# Patient Record
Sex: Male | Born: 1980 | Race: White | Hispanic: No | Marital: Single | Smoking: Former smoker
Health system: Southern US, Community
[De-identification: ages and names within clinical notes are randomized; demographics above are authoritative.]

## PROBLEM LIST (undated history)

## (undated) ENCOUNTER — Inpatient Hospital Stay (HOSPITAL_COMMUNITY): Payer: Medicare Other | Admitting: Podiatry

## (undated) DIAGNOSIS — I10 Essential (primary) hypertension: Secondary | ICD-10-CM

## (undated) DIAGNOSIS — F419 Anxiety disorder, unspecified: Secondary | ICD-10-CM

## (undated) DIAGNOSIS — F329 Major depressive disorder, single episode, unspecified: Secondary | ICD-10-CM

## (undated) DIAGNOSIS — I739 Peripheral vascular disease, unspecified: Secondary | ICD-10-CM

## (undated) DIAGNOSIS — K219 Gastro-esophageal reflux disease without esophagitis: Secondary | ICD-10-CM

## (undated) DIAGNOSIS — F32A Depression, unspecified: Secondary | ICD-10-CM

## (undated) DIAGNOSIS — G4730 Sleep apnea, unspecified: Secondary | ICD-10-CM

## (undated) DIAGNOSIS — E119 Type 2 diabetes mellitus without complications: Secondary | ICD-10-CM

## (undated) HISTORY — PX: OTHER SURGICAL HISTORY: SHX169

---
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
## 2004-05-06 DIAGNOSIS — F419 Anxiety disorder, unspecified: Secondary | ICD-10-CM

## 2004-05-06 HISTORY — DX: Anxiety disorder, unspecified: F41.9

## 2014-01-03 ENCOUNTER — Encounter (HOSPITAL_COMMUNITY): Payer: Self-pay | Admitting: Emergency Medicine

## 2014-01-03 ENCOUNTER — Emergency Department (HOSPITAL_COMMUNITY)
Admission: EM | Admit: 2014-01-03 | Discharge: 2014-01-03 | Disposition: A | Discharge status: Home / Self Care | Payer: Medicare Other | Attending: Emergency Medicine | Admitting: Emergency Medicine

## 2014-01-03 ENCOUNTER — Emergency Department (HOSPITAL_COMMUNITY): Payer: Medicare Other

## 2014-01-03 DIAGNOSIS — R5383 Other fatigue: Secondary | ICD-10-CM

## 2014-01-03 DIAGNOSIS — E669 Obesity, unspecified: Secondary | ICD-10-CM | POA: Insufficient documentation

## 2014-01-03 DIAGNOSIS — Z8719 Personal history of other diseases of the digestive system: Secondary | ICD-10-CM | POA: Insufficient documentation

## 2014-01-03 DIAGNOSIS — F17200 Nicotine dependence, unspecified, uncomplicated: Secondary | ICD-10-CM | POA: Insufficient documentation

## 2014-01-03 DIAGNOSIS — R5381 Other malaise: Secondary | ICD-10-CM | POA: Insufficient documentation

## 2014-01-03 DIAGNOSIS — R0989 Other specified symptoms and signs involving the circulatory and respiratory systems: Secondary | ICD-10-CM | POA: Insufficient documentation

## 2014-01-03 DIAGNOSIS — Z8659 Personal history of other mental and behavioral disorders: Secondary | ICD-10-CM | POA: Insufficient documentation

## 2014-01-03 DIAGNOSIS — R0609 Other forms of dyspnea: Secondary | ICD-10-CM | POA: Insufficient documentation

## 2014-01-03 DIAGNOSIS — R079 Chest pain, unspecified: Secondary | ICD-10-CM | POA: Insufficient documentation

## 2014-01-03 HISTORY — DX: Gastro-esophageal reflux disease without esophagitis: K21.9

## 2014-01-03 HISTORY — DX: Anxiety disorder, unspecified: F41.9

## 2014-01-03 LAB — CBC
HCT: 44.4 % (ref 39.0–52.0)
Hemoglobin: 15.7 g/dL (ref 13.0–17.0)
MCH: 30 pg (ref 26.0–34.0)
MCHC: 35.4 g/dL (ref 30.0–36.0)
MCV: 84.9 fL (ref 78.0–100.0)
Platelets: 231 10*3/uL (ref 150–400)
RBC: 5.23 MIL/uL (ref 4.22–5.81)
RDW: 13.1 % (ref 11.5–15.5)
WBC: 12.1 10*3/uL — ABNORMAL HIGH (ref 4.0–10.5)

## 2014-01-03 LAB — TROPONIN I: Troponin I: <0.3 ng/mL (ref <0.30)

## 2014-01-03 LAB — PROTIME-INR
INR: 1 (ref 0.00–1.49)
PROTHROMBIN TIME: 13.2 s (ref 11.6–15.2)

## 2014-01-03 LAB — COMPREHENSIVE METABOLIC PANEL
ALBUMIN: 3.8 g/dL (ref 3.5–5.2)
ALT: 28 U/L (ref 0–53)
ANION GAP: 14 (ref 5–15)
AST: 15 U/L (ref 0–37)
Alkaline Phosphatase: 48 U/L (ref 39–117)
BUN: 14 mg/dL (ref 6–23)
CALCIUM: 9.3 mg/dL (ref 8.4–10.5)
CO2: 26 mEq/L (ref 19–32)
CREATININE: 0.66 mg/dL (ref 0.50–1.35)
Chloride: 95 mEq/L — ABNORMAL LOW (ref 96–112)
GFR calc non Af Amer: >90 mL/min (ref >90)
Glucose, Bld: 207 mg/dL — ABNORMAL HIGH (ref 70–99)
Potassium: 3.8 mEq/L (ref 3.7–5.3)
Sodium: 135 mEq/L — ABNORMAL LOW (ref 137–147)
TOTAL PROTEIN: 7.3 g/dL (ref 6.0–8.3)
Total Bilirubin: 0.4 mg/dL (ref 0.3–1.2)

## 2014-01-03 LAB — MAGNESIUM: Magnesium: 2 mg/dL (ref 1.5–2.5)

## 2014-01-03 LAB — PRO B NATRIURETIC PEPTIDE

## 2014-01-03 MED ORDER — ASPIRIN 81 MG PO CHEW: 324.0000 mg | CHEWABLE_TABLET | Freq: Once | ORAL | Status: DC

## 2014-01-03 NOTE — ED Notes (Signed)
Pt presents to ED via EMS with c/o chest pain, radiates to left arm, onset today while at work. Pt states he felt like his heart was raising prior to chest pain. Pt denies chest pain at this time. Pt was seen at [facility] last 2 Sundays and PCP for the same. Pt was prescribed Ativan and Reglan at PCP. Pt reports Hx of anxiety and GERD. Per EMS, BP-118/86, HR-96-100 regular. Pt taken  ASA prior arrival to ED, EMS placed 20G IV line at left hand. Airway intact and alerts and oriented x4 at this time.

## 2014-01-03 NOTE — ED Notes (Signed)
Pt reports left sided cp that radiated to his left arm pit. 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 Pt states he had a hot flash, dizziness, and weakness in his arms. Pt states he believes it may have been a panic attack or gas. Pt states he takes ativan to control his anxiety and has been to ED 3 times in the last 10 days for the same sx. Pt denies any pain at this time.

## 2014-01-03 NOTE — ED Provider Notes (Signed)
CSN: [id number]     Arrival date & time 01/03/14  1855 History   First MD Initiated Contact with Patient 01/03/14 1855     Chief Complaint  Patient presents with  . Chest Pain     (Consider location/radiation/quality/duration/timing/severity/associated sxs/prior Treatment) HPI Patient presents with chest pain.  Patient has had episodes in the past few days that are similar to today's. He notes anterior chest pressure. There is associated diaphoresis, dyspnea, fatigue, anxiety he has been seen at other facilities in the past few days, including yesterday, when he was started on an anxiolytic. Patient currently denies dyspnea, palpitations, lightheadedness, syncope. Patient notes that he has a history of anxiety, as well as heartburn. He denies a history of coronary disease. Patient does not smoke. Patient uses smokeless tobacco.  Past Medical History  Diagnosis Date  . GERD (gastroesophageal reflux disease)   . Anxiety 2006   History reviewed. No pertinent past surgical history. History reviewed. No pertinent family history. History  Substance Use Topics  . Smoking status: Current Every Day Smoker -- 0.00 packs/day    Types: Cigarettes  . Smokeless tobacco: Not on file  . Alcohol Use: Not on file    Review of Systems  Constitutional:       Per HPI, otherwise negative  HENT:       Per HPI, otherwise negative  Respiratory:       Per HPI, otherwise negative  Cardiovascular:       Per HPI, otherwise negative  Gastrointestinal: Negative for vomiting.  Endocrine:       Negative aside from HPI  Genitourinary:       Neg aside from HPI   Musculoskeletal:       Per HPI, otherwise negative  Skin: Negative.   Neurological: Negative for syncope.      Allergies  Review of patient's allergies indicates no known allergies.  Home Medications   Prior to Admission medications   Not on File   BP 160/94  Pulse 99  Temp(Src) 98.1 F (36.7 C) (Oral)  Resp 19  SpO2  95% Physical Exam  Nursing note and vitals reviewed. Constitutional: He is oriented to person, place, and time. He appears well-developed. No distress.  Obese young male  HENT:  Head: Normocephalic and atraumatic.  Eyes: Conjunctivae and EOM are normal.  Cardiovascular: Normal rate and regular rhythm.   Pulmonary/Chest: Effort normal. No stridor. No respiratory distress.  Abdominal: He exhibits no distension.  Musculoskeletal: He exhibits no edema.  Neurological: He is alert and oriented to person, place, and time.  Skin: Skin is warm and dry.  Psychiatric: His mood appears anxious.    ED Course  Procedures (including critical care time) Labs Review Labs Reviewed  CBC - Abnormal; Notable for the following:    WBC 12.1 (*)    All other components within normal limits  COMPREHENSIVE METABOLIC PANEL - Abnormal; Notable for the following:    Sodium 135 (*)    Chloride 95 (*)    Glucose, Bld 207 (*)    All other components within normal limits  PRO B NATRIURETIC PEPTIDE  MAGNESIUM  PROTIME-INR  TROPONIN I    Imaging Review Dg Chest 2 View  01/03/2014   CLINICAL DATA:  Panic attack, chest pain  EXAM: CHEST  2 VIEW  COMPARISON:  12/26/2013  FINDINGS: Lungs are clear.  No pleural effusion or pneumothorax.  The heart is normal in size.  Visualized osseous structures are within normal limits.  IMPRESSION: Normal chest  radiographs.   Electronically Signed   By: Charline Bills M.D.   On: 01/03/2014 20:04    O2- 99%ra, nml EKG with sinus tachycardia, rate 100, T wave abnormalities, abnormal  MDM  This young male presents with ongoing chest pain.  Notably, the patient seen yesterday for similar concerns, and on initial exam is uncomfortable appearing, anxious, though in no distress. Patient has appropriate cognition, and given the patient's youth, he is normal troponin, nonischemic EKG are reassuring. Patient was recently started on anxiolytics, which seems appropriate. Patient  does have mild leukocytosis, though his labs are otherwise largely reassuring.  The patient was prepared for discharge with further evaluation and management as an outpatient.   Gerhard Munch, MD 01/03/14 2129

## 2014-01-03 NOTE — ED Notes (Signed)
Discharge instructions reviewed with pt. Pt verbalized understanding.   

## 2014-01-03 NOTE — Discharge Instructions (Signed)
As discussed, your evaluation today has been largely reassuring.  But, it is important that you monitor your condition carefully, and do not hesitate to return to the ED if you develop new, or concerning changes in your condition. ° °Otherwise, please follow-up with your physician for appropriate ongoing care. ° °Chest Pain (Nonspecific) °It is often hard to give a specific diagnosis for the cause of chest pain. There is always a chance that your pain could be related to something serious, such as a heart attack or a blood clot in the lungs. You need to follow up with your health care provider for further evaluation. °CAUSES  °· Heartburn. °· Pneumonia or bronchitis. °· Anxiety or stress. °· Inflammation around your heart (pericarditis) or lung (pleuritis or pleurisy). °· A blood clot in the lung. °· A collapsed lung (pneumothorax). It can develop suddenly on its own (spontaneous pneumothorax) or from trauma to the chest. °· Shingles infection (herpes zoster virus). °The chest wall is composed of bones, muscles, and cartilage. Any of these can be the source of the pain. °· The bones can be bruised by injury. °· The muscles or cartilage can be strained by coughing or overwork. °· The cartilage can be affected by inflammation and become sore (costochondritis). °DIAGNOSIS  °Lab tests or other studies may be needed to find the cause of your pain. Your health care provider may have you take a test called an ambulatory electrocardiogram (ECG). An ECG records your heartbeat patterns over a 24-hour period. You may also have other tests, such as: °· Transthoracic echocardiogram (TTE). During echocardiography, sound waves are used to evaluate how blood flows through your heart. °· Transesophageal echocardiogram (TEE). °· Cardiac monitoring. This allows your health care provider to monitor your heart rate and rhythm in real time. °· Holter monitor. This is a portable device that records your heartbeat and can help diagnose  heart arrhythmias. It allows your health care provider to track your heart activity for several days, if needed. °· Stress tests by exercise or by giving medicine that makes the heart beat faster. °TREATMENT  °· Treatment depends on what may be causing your chest pain. Treatment may include: °¨ Acid blockers for heartburn. °¨ Anti-inflammatory medicine. °¨ Pain medicine for inflammatory conditions. °¨ Antibiotics if an infection is present. °· You may be advised to change lifestyle habits. This includes stopping smoking and avoiding alcohol, caffeine, and chocolate. °· You may be advised to keep your head raised (elevated) when sleeping. This reduces the chance of acid going backward from your stomach into your esophagus. °Most of the time, nonspecific chest pain will improve within 2-3 days with rest and mild pain medicine.  °HOME CARE INSTRUCTIONS  °· If antibiotics were prescribed, take them as directed. Finish them even if you start to feel better. °· For the next few days, avoid physical activities that bring on chest pain. Continue physical activities as directed. °· Do not use any tobacco products, including cigarettes, chewing tobacco, or electronic cigarettes. °· Avoid drinking alcohol. °· Only take medicine as directed by your health care provider. °· Follow your health care provider's suggestions for further testing if your chest pain does not go away. °· Keep any follow-up appointments you made. If you do not go to an appointment, you could develop lasting (chronic) problems with pain. If there is any problem keeping an appointment, call to reschedule. °SEEK MEDICAL CARE IF:  °· Your chest pain does not go away, even after treatment. °· You have   a rash with blisters on your chest. °· You have a fever. °SEEK IMMEDIATE MEDICAL CARE IF:  °· You have increased chest pain or pain that spreads to your arm, neck, jaw, back, or abdomen. °· You have shortness of breath. °· You have an increasing cough, or you  cough up blood. °· You have severe back or abdominal pain. °· You feel nauseous or vomit. °· You have severe weakness. °· You faint. °· You have chills. °This is an emergency. Do not wait to see if the pain will go away. Get medical help at once. Call your local emergency services (911 in [place] Do not drive yourself to the hospital. °MAKE SURE YOU:  °· Understand these instructions. °· Will watch your condition. °· Will get help right away if you are not doing well or get worse. °Document Released: 01/30/2005 Document Revised: 04/27/2013 Document Reviewed: 11/26/2007 °[employer]® Patient Information ©2015 [employer]. This information is not intended to replace advice given to you by your health care provider. Make sure you discuss any questions you have with your health care provider. ° °

## 2015-12-21 IMAGING — CR DG CHEST 2V
2 series · 2 of 2 positions shown · non-contrast
Comparison: 12/26/2013

CLINICAL DATA: Panic attack, chest pain

EXAM:
CHEST  2 VIEW

[w chest pa]
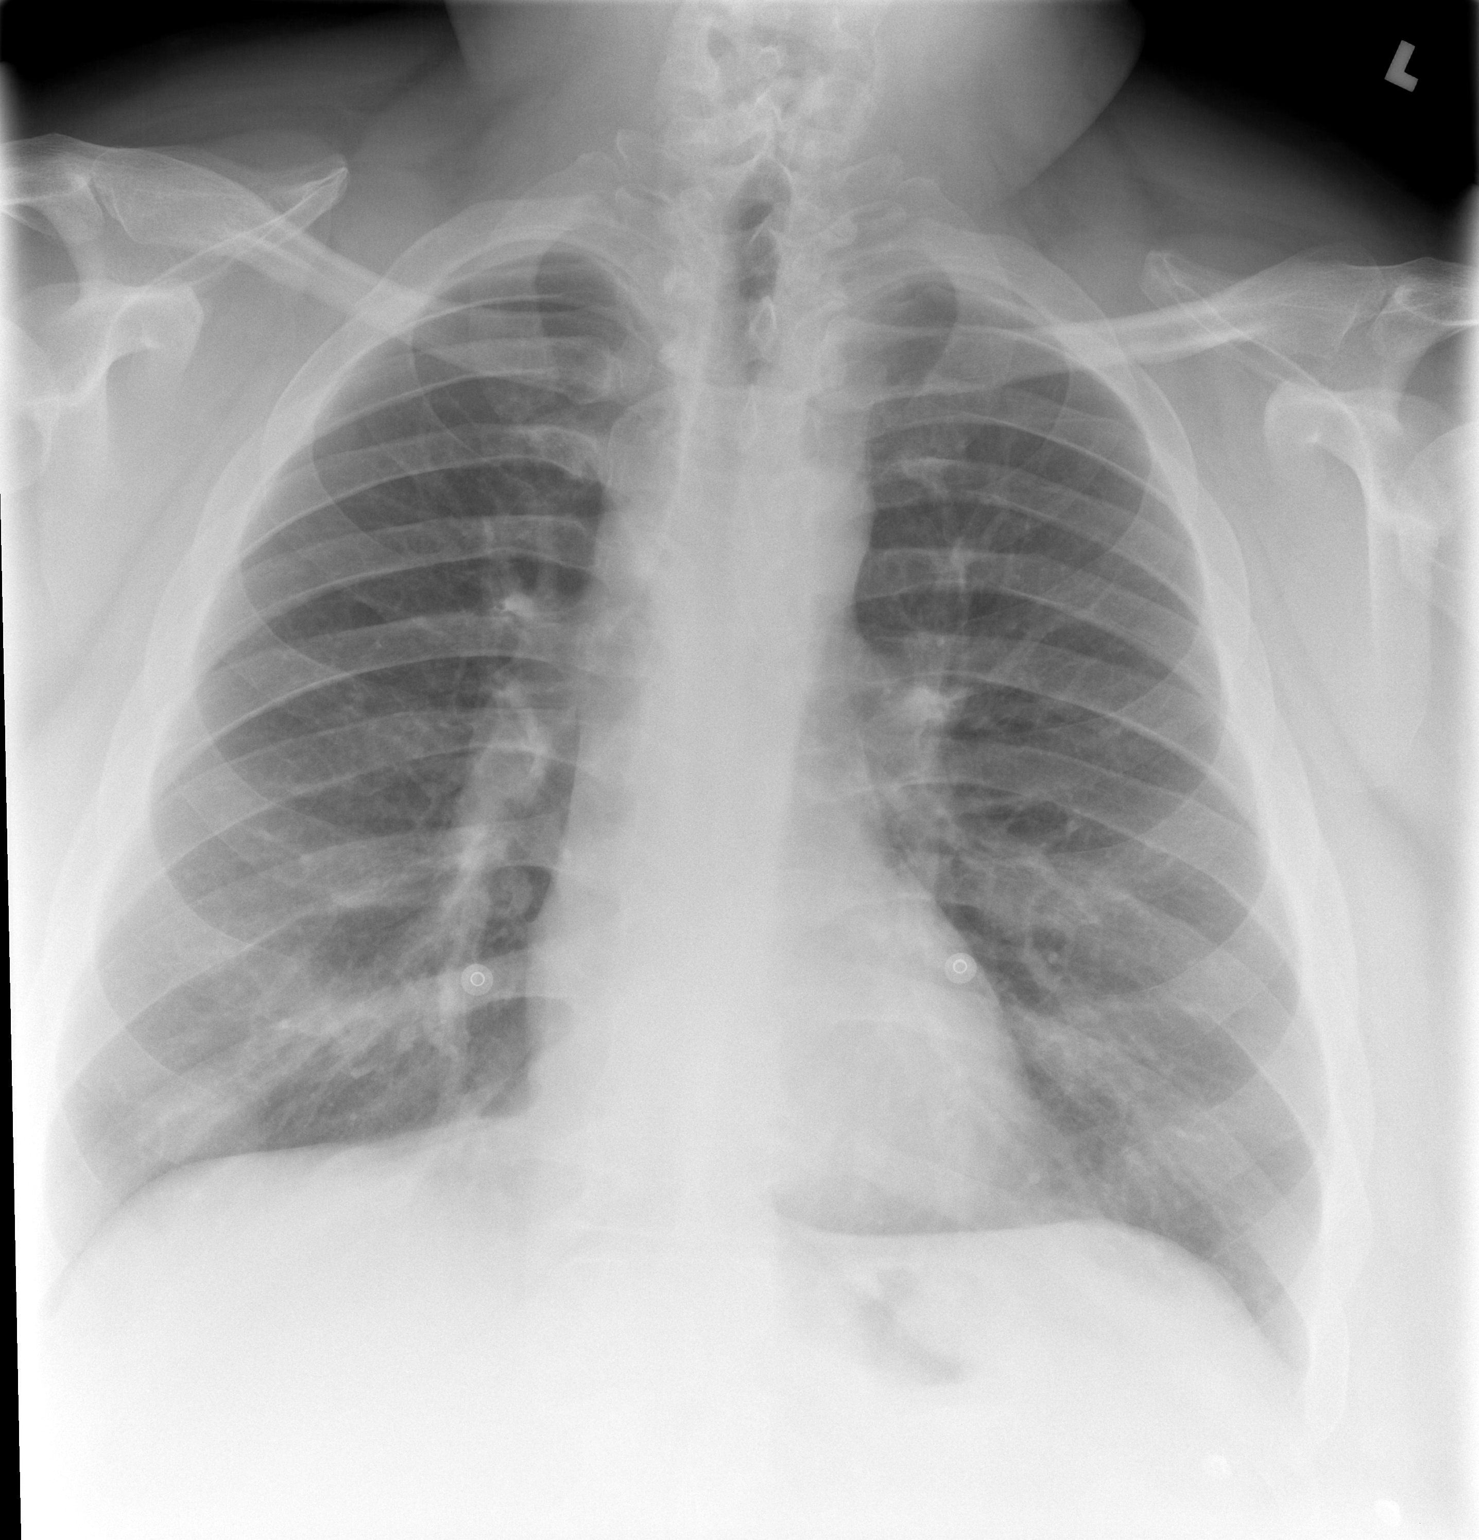

[w chest lat]
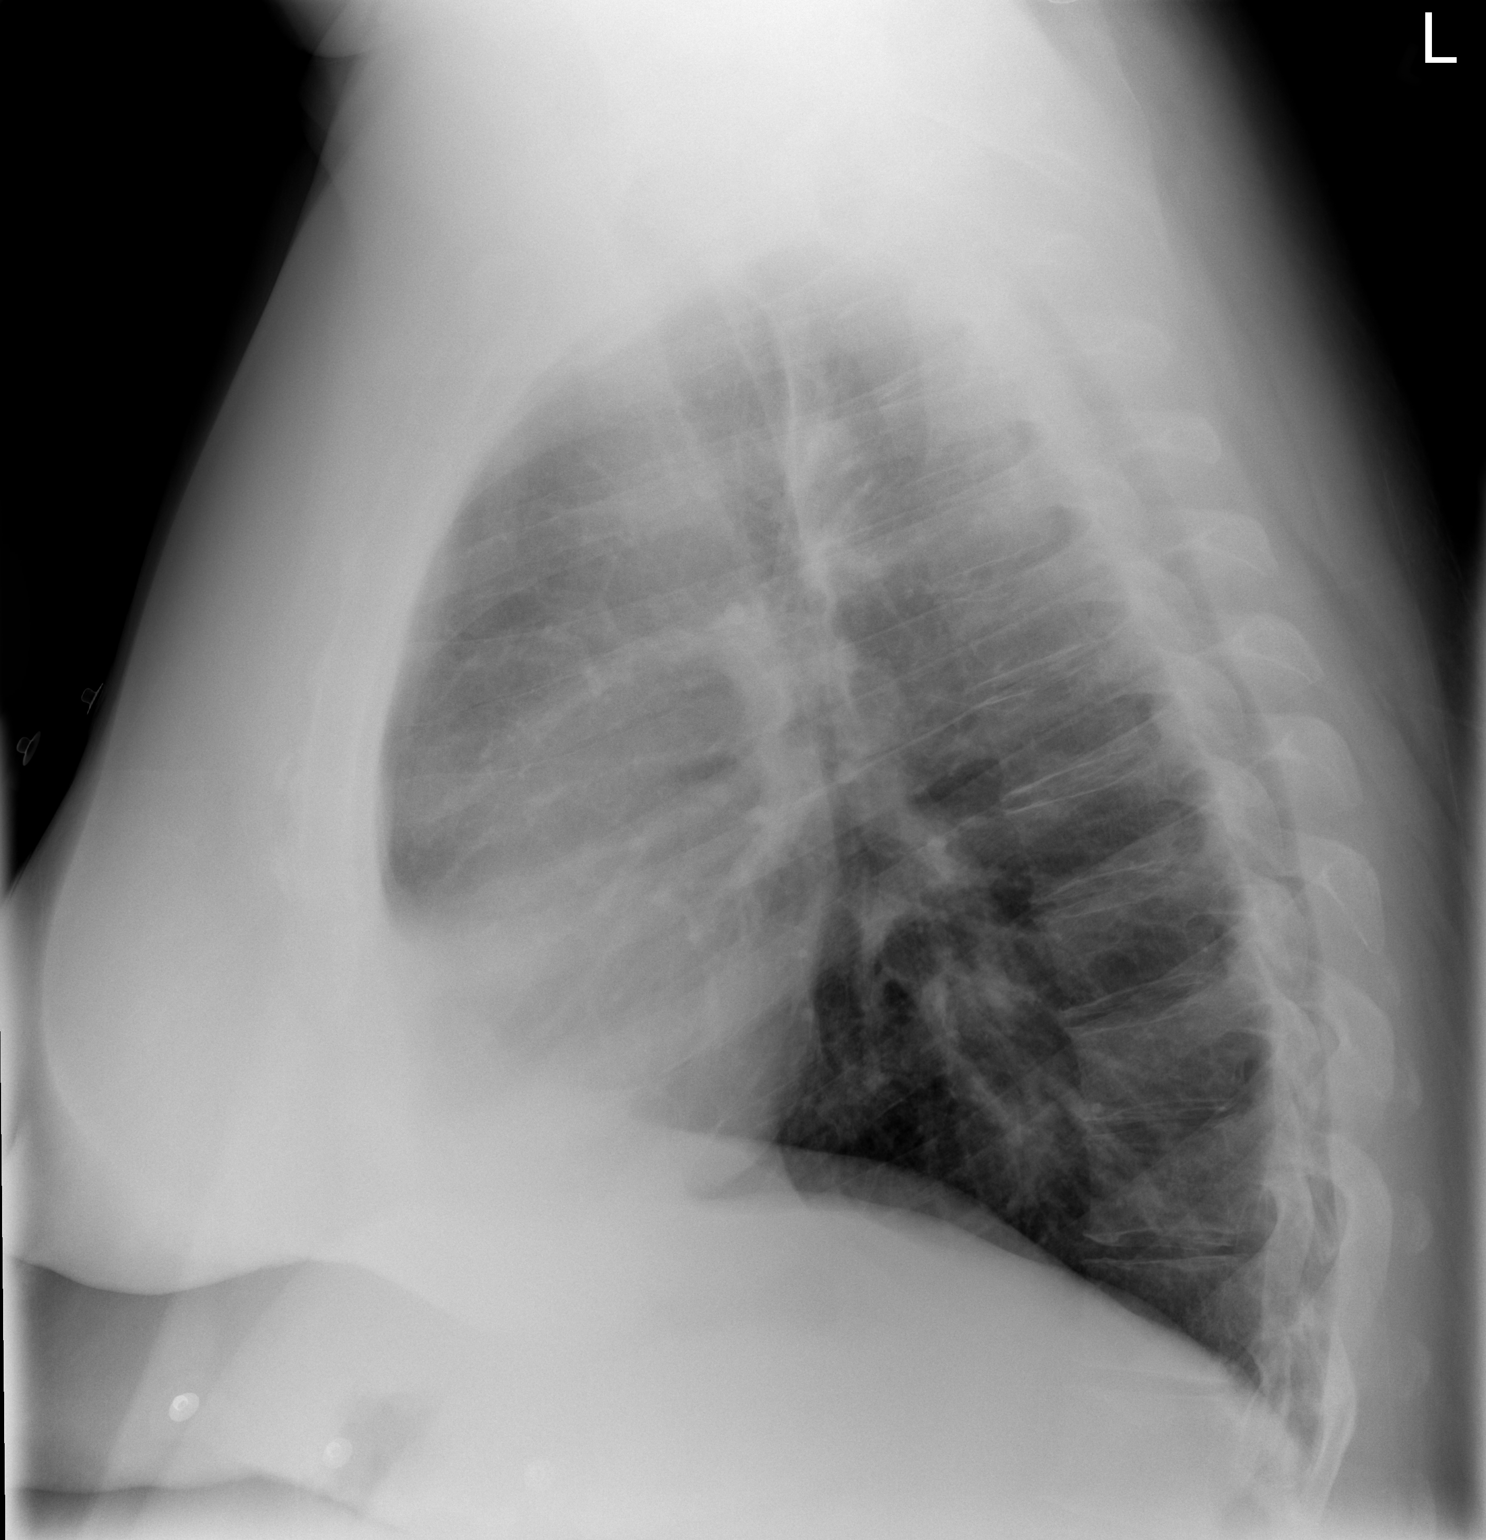

[2 of 2 positions shown; findings below may reference images not displayed]

FINDINGS: Lungs are clear.  No pleural effusion or pneumothorax.

The heart is normal in size.

Visualized osseous structures are within normal limits.
IMPRESSION: Normal chest radiographs.

## 2017-05-27 DIAGNOSIS — F4310 Post-traumatic stress disorder, unspecified: Secondary | ICD-10-CM | POA: Insufficient documentation

## 2018-12-10 DIAGNOSIS — L97522 Non-pressure chronic ulcer of other part of left foot with fat layer exposed: Secondary | ICD-10-CM | POA: Insufficient documentation

## 2018-12-21 DIAGNOSIS — M2042 Other hammer toe(s) (acquired), left foot: Secondary | ICD-10-CM | POA: Insufficient documentation

## 2019-01-30 DIAGNOSIS — R45851 Suicidal ideations: Secondary | ICD-10-CM | POA: Insufficient documentation

## 2019-02-11 DIAGNOSIS — E119 Type 2 diabetes mellitus without complications: Secondary | ICD-10-CM | POA: Insufficient documentation

## 2019-02-11 DIAGNOSIS — I10 Essential (primary) hypertension: Secondary | ICD-10-CM | POA: Insufficient documentation

## 2019-03-05 DIAGNOSIS — Z6841 Body Mass Index (BMI) 40.0 and over, adult: Secondary | ICD-10-CM | POA: Insufficient documentation

## 2019-04-05 ENCOUNTER — Other Ambulatory Visit: Payer: Self-pay

## 2019-04-05 ENCOUNTER — Other Ambulatory Visit: Payer: Self-pay | Admitting: Podiatry

## 2019-04-05 ENCOUNTER — Ambulatory Visit (INDEPENDENT_AMBULATORY_CARE_PROVIDER_SITE_OTHER): Payer: Medicare Other | Admitting: Podiatry

## 2019-04-05 ENCOUNTER — Ambulatory Visit (INDEPENDENT_AMBULATORY_CARE_PROVIDER_SITE_OTHER): Payer: Medicare Other

## 2019-04-05 DIAGNOSIS — L97524 Non-pressure chronic ulcer of other part of left foot with necrosis of bone: Secondary | ICD-10-CM

## 2019-04-05 DIAGNOSIS — M792 Neuralgia and neuritis, unspecified: Secondary | ICD-10-CM

## 2019-04-05 DIAGNOSIS — Z9889 Other specified postprocedural states: Secondary | ICD-10-CM

## 2019-04-05 DIAGNOSIS — L03032 Cellulitis of left toe: Secondary | ICD-10-CM

## 2019-04-05 DIAGNOSIS — L02612 Cutaneous abscess of left foot: Secondary | ICD-10-CM

## 2019-04-05 NOTE — Progress Notes (Signed)
  Subjective:  Patient ID: Johnny Griffin, male    DOB: [birth year],  MRN: [id number]  Chief Complaint  Patient presents with  . Routine Post Op    POV 02/13/19 2nd partial amputation -pt states he wasn't able to F/U with his sx Dr. due to transporation issues -pt dneis N/V/F/Ch/swelling or drainage -w/ slight redness Tx: none -w/ occasional pain at lateral sid eof foot     DOS: 02/13/2019 Procedure: Left 2nd toe DIPJ amputation (Dr. Neomia Dear - [facility])  38 y.o. male returns for post-op check. Hx as above. Was not able to f/u with original surgeon d/t transportation issues.  Review of Systems: Negative except as noted in the HPI. Denies N/V/F/Ch.  Past Medical History:  Diagnosis Date  . Anxiety 2006  . GERD (gastroesophageal reflux disease)     Current Outpatient Medications:  .  aspirin 325 MG tablet, Take 325 mg by mouth daily., Disp: , Rfl:  .  ketorolac (TORADOL) 10 MG tablet, Take 10 mg by mouth every 6 (six) hours as needed for moderate pain., Disp: , Rfl:  .  lisinopril-hydrochlorothiazide (PRINZIDE,ZESTORETIC) 20-12.5 MG per tablet, Take 1 tablet by mouth daily., Disp: , Rfl:  .  LORazepam (ATIVAN) 1 MG tablet, Take 1 mg by mouth every 8 (eight) hours., Disp: , Rfl:  .  metFORMIN (GLUCOPHAGE) 1000 MG tablet, Take 1,000 mg by mouth 2 (two) times daily with a meal., Disp: , Rfl:  .  metoCLOPramide (REGLAN) 10 MG tablet, Take 10 mg by mouth 4 (four) times daily., Disp: , Rfl:  .  PARoxetine (PAXIL) 20 MG tablet, Take 20 mg by mouth daily., Disp: , Rfl:   Social History   Tobacco Use  Smoking Status Current Every Day Smoker  . Packs/day: 0.00  . Types: Cigarettes    No Known Allergies Objective:  There were no vitals filed for this visit. There is no height or weight on file to calculate BMI. Constitutional Well developed. Well nourished.  Vascular Foot warm and well perfused. Capillary refill normal to all digits.   Neurologic Normal speech. Oriented to person,  place, and time. Epicritic sensation to light touch grossly present bilaterally.  Dermatologic Skin healing well without signs of infection. Skin edges well coapted without signs of infection.  Orthopedic: No tenderness to palpation noted about the surgical site.   Radiographs: Taken and reviewed c/w post-op state. Assessment:   1. Ulcer of toe, left, with necrosis of bone ([place])   2. Post-operative state   3. Cellulitis and abscess of toe of left foot   4. Peripheral neuropathic pain    Plan:  Patient was evaluated and treated and all questions answered.  S/p foot surgery left -Progressing as expected post-operatively. -XR: As above -WB Status: WBAT in normal shoegear -Sutures: removed. Ointment and band-aid applied. -Medications: None -Foot redressed.  Neuropathic pain -Discussed continued use of Gabapentin and controlling sugars more stringently to prevent pain.  Return in about 1 month (around 05/05/2019) for Post-Op (No XRs).

## 2019-04-11 DIAGNOSIS — I10 Essential (primary) hypertension: Secondary | ICD-10-CM | POA: Diagnosis not present

## 2019-04-11 DIAGNOSIS — E119 Type 2 diabetes mellitus without complications: Secondary | ICD-10-CM | POA: Diagnosis not present

## 2019-04-11 DIAGNOSIS — E871 Hypo-osmolality and hyponatremia: Secondary | ICD-10-CM

## 2019-04-12 DIAGNOSIS — L601 Onycholysis: Secondary | ICD-10-CM | POA: Diagnosis not present

## 2019-04-12 DIAGNOSIS — M79674 Pain in right toe(s): Secondary | ICD-10-CM | POA: Diagnosis not present

## 2019-04-12 DIAGNOSIS — I10 Essential (primary) hypertension: Secondary | ICD-10-CM | POA: Diagnosis not present

## 2019-04-12 DIAGNOSIS — L02619 Cutaneous abscess of unspecified foot: Secondary | ICD-10-CM

## 2019-04-12 DIAGNOSIS — L03119 Cellulitis of unspecified part of limb: Secondary | ICD-10-CM | POA: Diagnosis not present

## 2019-04-12 DIAGNOSIS — E871 Hypo-osmolality and hyponatremia: Secondary | ICD-10-CM | POA: Diagnosis not present

## 2019-04-12 DIAGNOSIS — E119 Type 2 diabetes mellitus without complications: Secondary | ICD-10-CM | POA: Diagnosis not present

## 2019-04-12 NOTE — Progress Notes (Signed)
Patient seen as consult at [facility]

## 2019-04-13 DIAGNOSIS — E119 Type 2 diabetes mellitus without complications: Secondary | ICD-10-CM | POA: Diagnosis not present

## 2019-04-13 DIAGNOSIS — I10 Essential (primary) hypertension: Secondary | ICD-10-CM | POA: Diagnosis not present

## 2019-04-13 DIAGNOSIS — E871 Hypo-osmolality and hyponatremia: Secondary | ICD-10-CM | POA: Diagnosis not present

## 2019-04-13 NOTE — Progress Notes (Signed)
Seen for follow up at [facility] today

## 2019-04-14 DIAGNOSIS — E119 Type 2 diabetes mellitus without complications: Secondary | ICD-10-CM | POA: Diagnosis not present

## 2019-04-14 DIAGNOSIS — E871 Hypo-osmolality and hyponatremia: Secondary | ICD-10-CM | POA: Diagnosis not present

## 2019-04-14 DIAGNOSIS — I10 Essential (primary) hypertension: Secondary | ICD-10-CM | POA: Diagnosis not present

## 2019-05-10 ENCOUNTER — Other Ambulatory Visit: Payer: Self-pay

## 2019-05-10 ENCOUNTER — Ambulatory Visit (INDEPENDENT_AMBULATORY_CARE_PROVIDER_SITE_OTHER): Payer: Medicare Other | Admitting: Podiatry

## 2019-05-10 DIAGNOSIS — E1142 Type 2 diabetes mellitus with diabetic polyneuropathy: Secondary | ICD-10-CM | POA: Diagnosis not present

## 2019-05-10 DIAGNOSIS — M2041 Other hammer toe(s) (acquired), right foot: Secondary | ICD-10-CM | POA: Diagnosis not present

## 2019-05-10 DIAGNOSIS — M2042 Other hammer toe(s) (acquired), left foot: Secondary | ICD-10-CM

## 2019-05-10 DIAGNOSIS — E11621 Type 2 diabetes mellitus with foot ulcer: Secondary | ICD-10-CM

## 2019-05-10 DIAGNOSIS — Z6841 Body Mass Index (BMI) 40.0 and over, adult: Secondary | ICD-10-CM

## 2019-05-10 DIAGNOSIS — F329 Major depressive disorder, single episode, unspecified: Secondary | ICD-10-CM | POA: Insufficient documentation

## 2019-05-10 DIAGNOSIS — L97522 Non-pressure chronic ulcer of other part of left foot with fat layer exposed: Secondary | ICD-10-CM | POA: Diagnosis not present

## 2019-05-10 DIAGNOSIS — E1165 Type 2 diabetes mellitus with hyperglycemia: Secondary | ICD-10-CM | POA: Insufficient documentation

## 2019-05-10 DIAGNOSIS — B351 Tinea unguium: Secondary | ICD-10-CM

## 2019-05-10 DIAGNOSIS — S90425A Blister (nonthermal), left lesser toe(s), initial encounter: Secondary | ICD-10-CM

## 2019-05-10 DIAGNOSIS — L97511 Non-pressure chronic ulcer of other part of right foot limited to breakdown of skin: Secondary | ICD-10-CM

## 2019-05-10 DIAGNOSIS — L97519 Non-pressure chronic ulcer of other part of right foot with unspecified severity: Secondary | ICD-10-CM

## 2019-05-10 NOTE — Progress Notes (Signed)
Subjective:  Patient ID: Johnny Griffin, male    DOB: 1981-02-12,  MRN: [id number]  Chief Complaint  Patient presents with  . Routine Post Op    Pt states sx site if fine but the big toenail and the 3rd toenails are turning dark and big hallux toenail was oozing yesteday." -pt dneies injury -discoloration under Lt hallux Tx: none -7-10/10 shapr pains across toes (2nd-5th)  . foot lesion    Rt hallux joint ares lesion x 1 wk;l no pain -pt states Diabetic shoe was rubbign against that are -pt denies swellign -w/ slight redness and scabbed area -pt states it was probably a blister he had and it popped by itself since there wa a stain of blood in his sock Tx: abx ointmetn and bandaid -pt denies any current draiange    39 y.o. male presents with the above complaint. History confirmed with patient.  Objective:  Physical Exam: warm, good capillary refill, normal DP and PT pulses, normal sensory exam and ulceration at left 3rd toe distal aspect without warmth, erythema signs of infection. Surrounding blister with ss drainage. Right 1st MPJ ulceration without warmth erythema signs of infecton. Right 2nd toe ulceration improving. Left Foot: normal exam, no swelling, tenderness, instability; ligaments intact, full range of motion of all ankle/foot joints  Right Foot: normal exam, no swelling, tenderness, instability; ligaments intact, full range of motion of all ankle/foot joints      Radiographs:  X-ray of both feet: not indicated   Assessment:   1. Ulcer of right foot due to type 2 diabetes mellitus (HCC)   2. Hammertoe, bilateral   3. Blister of third toe of left foot, initial encounter   4. DM type 2 with diabetic peripheral neuropathy (HCC)   5. Onychomycosis   6. Diabetic ulcer of toe of left foot associated with type 2 diabetes mellitus, with fat layer exposed (HCC)   7. Morbid obesity with BMI of 45.0-49.9, adult (HCC)   8. Ulcer of toe, right, limited to breakdown of skin [facility])      Plan:  Patient was evaluated and treated and all questions answered.  Hammertoe left 3rd toe with diabetic ulcer -Educated on etiology of deformity -Discussed with patient that they would benefit from surgical intervention after having failed all conservative therapy.  -Discussed he is at high risk of amputation if the ulcer continues untreated. -Discussed performing flexor tenotomy today. Patient agrees to proceed. Likely will have to perform on other digits at later date. Will plan to do so as indicated. -Sx shoe dispensed -Performed as below  Procedure: Flexor Tenotomy Indication for Procedure: toe with semi-reducible hammertoe with distal tip ulceration. Flexor tenotomy indicated to alleviate contracture, reduce pressure, and enhance healing of the ulceration. Location: left, 3rd toe Anesthesia: Lidocaine 1% plain; 1.5 mL and Marcaine 0.5% plain; 1.5 mL digital block Instrumentation: 18 g needle  Technique: The toe was anesthetized as above and prepped in the usual fashion. The toe was exsanquinated and a tourniquet was secured at the base of the toe. An 18g needle was then used to percutaneously release the flexor tendon at the plantar surface of the toe with noted release of the hammertoe deformity. The incision was then dressed with antibiotic ointment and band-aid. Compression splint dressing applied. Patient tolerated the procedure well. Dressing: Dry, sterile, compression dressing. Disposition: Patient tolerated procedure well. Patient to return in 1 week for follow-up.   Diabetic ulcer right 1st MPJ -Dressing applied consisting of povidone and sterile gauze  Right 2nd toe ulceration -Healed, no signs of infeciton  Amputation Hx and Onychomycosis -Patient is diabetic with a qualifying condition for at risk foot care.  Procedure: Nail Debridement Rationale: Patient meets criteria for routine foot care due to amputation hx Type of Debridement: manual, sharp  debridement. Instrumentation: Nail nipper, rotary burr. Number of Nails: 9  Return in about 2 weeks (around 05/24/2019) for Post-Op (No XRs) from flexor tenotomy.   MDM Number of Diagnoses or Management Options Blister of third toe of left foot, initial encounter: new, needed workup Diabetic ulcer of toe of left foot associated with type 2 diabetes mellitus, with fat layer exposed ([place]): new, needed workup Hammertoe, bilateral: new, needed workup Onychomycosis: minor Ulcer of right foot due to type 2 diabetes mellitus ([place]): new, needed workup Ulcer of toe, right, limited to breakdown of skin ([place]): established, improving Risk of Complications, Morbidity, and/or Mortality Presenting problems: high Management options: high  Patient Progress Patient progress: stable

## 2019-05-24 ENCOUNTER — Encounter: Payer: Medicare Other | Admitting: Podiatry

## 2019-05-31 ENCOUNTER — Other Ambulatory Visit: Payer: Self-pay

## 2019-05-31 ENCOUNTER — Ambulatory Visit (INDEPENDENT_AMBULATORY_CARE_PROVIDER_SITE_OTHER): Payer: Medicare Other

## 2019-05-31 ENCOUNTER — Ambulatory Visit (INDEPENDENT_AMBULATORY_CARE_PROVIDER_SITE_OTHER): Payer: Medicare Other | Admitting: Podiatry

## 2019-05-31 DIAGNOSIS — E11621 Type 2 diabetes mellitus with foot ulcer: Secondary | ICD-10-CM

## 2019-05-31 DIAGNOSIS — M2041 Other hammer toe(s) (acquired), right foot: Secondary | ICD-10-CM | POA: Diagnosis not present

## 2019-05-31 DIAGNOSIS — L97519 Non-pressure chronic ulcer of other part of right foot with unspecified severity: Secondary | ICD-10-CM

## 2019-05-31 MED ORDER — DOXYCYCLINE HYCLATE 100 MG PO TABS: 100.0000 mg | ORAL_TABLET | Freq: Two times a day (BID) | ORAL | 0 refills | Status: DC

## 2019-05-31 NOTE — Progress Notes (Signed)
  Subjective:  Patient ID: Johnny Griffin, male    DOB: 12-04-1980,  MRN: [id number]  Chief Complaint  Patient presents with  . flexor tenotomy    F/U from Lt 3rd toe flexor tenotomy Pt. states,"   . Foot Ulcer    F/U rt 2nd toe    39 y.o. male presents for wound care. Hx confirmed with patient. States the right 2nd toe is doing much worse. The left foot is doing much better, however, and has some peeling skin around the toe that was worked on last time. Objective:  Physical Exam: Wound Location: Right 2nd toe Wound Measurement: 1x0.5 Wound Base: Mixed Granular/Fibrotic Peri-wound: Reddened, Calloused Exudate: Scant/small amount Serosanguinous exudate appears to have worsened since last recheck, intact blister noted, + induration, + tenderness and erythema noted along wound margins extending 2 cm     Radiographs:  X-ray of the right foot: no fracture, dislocation, swelling or degenerative changes noted and no osseous erosion  Assessment:   1. Ulcer of right foot due to type 2 diabetes mellitus (HCC)   2. Hammertoe of right foot      Plan:  Patient was evaluated and treated and all questions answered.  Right 2nd Toe Hammertoe with ulcer -Discussed while there is no sign of bone infection right now he is at high risk of amputation of the digit. -XR reviewed with patient -Dressing applied consisting of medihoney -Offload ulcer with surgical shoe -Surgical shoe dispensed -Wound cleansed and debrided -Would consider tenotomy if redness resolved next visit. -Rx doxycycline for erythema.  Procedure: Selective Debridement of Wound Rationale: Removal of devitalized tissue from the wound to promote healing.  Pre-Debridement Wound Measurements: 1 cm x 0.5 cm x 0.2 cm  Post-Debridement Wound Measurements: same as pre-debridement. Type of Debridement: sharp selective Tissue Removed: Devitalized soft-tissue Dressing: Dry, sterile, compression dressing. Disposition: Patient  tolerated procedure well. Patient to return in 1 week for follow-up.  Hx ulcer left 3rd toe -healed s/p tenotomy  Return in about 1 week (around 06/07/2019) for Wound Care, Right.  MDM

## 2019-06-07 ENCOUNTER — Ambulatory Visit (INDEPENDENT_AMBULATORY_CARE_PROVIDER_SITE_OTHER): Payer: Medicare Other | Admitting: Podiatry

## 2019-06-07 ENCOUNTER — Ambulatory Visit (INDEPENDENT_AMBULATORY_CARE_PROVIDER_SITE_OTHER): Payer: Medicare Other

## 2019-06-07 ENCOUNTER — Other Ambulatory Visit: Payer: Self-pay

## 2019-06-07 ENCOUNTER — Telehealth: Payer: Self-pay | Admitting: Podiatry

## 2019-06-07 DIAGNOSIS — M2041 Other hammer toe(s) (acquired), right foot: Secondary | ICD-10-CM

## 2019-06-07 DIAGNOSIS — L97519 Non-pressure chronic ulcer of other part of right foot with unspecified severity: Secondary | ICD-10-CM | POA: Diagnosis not present

## 2019-06-07 DIAGNOSIS — L02619 Cutaneous abscess of unspecified foot: Secondary | ICD-10-CM

## 2019-06-07 DIAGNOSIS — L03119 Cellulitis of unspecified part of limb: Secondary | ICD-10-CM

## 2019-06-07 DIAGNOSIS — E11621 Type 2 diabetes mellitus with foot ulcer: Secondary | ICD-10-CM | POA: Diagnosis not present

## 2019-06-07 MED ORDER — TRAMADOL HCL 50 MG PO TABS: 50.0000 mg | ORAL_TABLET | Freq: Four times a day (QID) | ORAL | 0 refills | Status: AC | PRN

## 2019-06-07 MED ORDER — CLINDAMYCIN HCL 300 MG PO CAPS: 300.0000 mg | ORAL_CAPSULE | Freq: Two times a day (BID) | ORAL | 0 refills | Status: DC

## 2019-06-07 NOTE — Telephone Encounter (Signed)
Pt called and was seen today and the antibiotic was called in but pt stated you were to call something in for pain and they did not have anything. Could you please call something in for pt

## 2019-06-07 NOTE — Progress Notes (Signed)
  Subjective:  Patient ID: Johnny Griffin, male    DOB: 26-May-1980,  MRN: [id number]  Chief Complaint  Patient presents with  . Wound Check    F?U Rt 2nd toe ulcer Pt. states," I thinlk it's worse since there's more redess and apinful; 5/10 burning pain." -pt denies draiange -w/ swellgin tx: sx shoe and doxycycline    39 y.o. male presents for wound care. Hx confirmed with patient. Having a lot of pain at the toe. Has some nausea but no other systemic signs of infection. Objective:  Physical Exam: Wound Location: Right 2nd toe Wound Measurement: 1x0.5 Wound Base: Mixed Granular/Fibrotic Peri-wound: Reddened, Calloused Exudate: Scant/small amount Serosanguinous exudate Worsened compared to last check with possible worsening erythema, now with probe to bone distally, + induration, + tenderness and erythema noted along wound margins extending 2.5 cm  Radiographs:  X-ray of the right foot: no fracture, dislocation, swelling or degenerative changes noted and no osseous erosion  Assessment:   1. Ulcer of right foot due to type 2 diabetes mellitus (HCC)   2. Hammertoe of right foot      Plan:  Patient was evaluated and treated and all questions answered.  Right 2nd Toe Hammertoe with ulcer -Repeat XR without definite osseous erosions. -Given new probe to bone and continued and worsening pain I am worried about osteomyelitis. We will trial changing his antibiotic to clindamycin given continued erythema -Should this persist would consider discussion of partial toe amputation. -Dress with medihoney daily -Continue surgical shoe.  Hx ulcer left 3rd toe -healed s/p tenotomy  Return in about 1 week (around 06/14/2019) for Wound Care, Right.

## 2019-06-14 ENCOUNTER — Other Ambulatory Visit: Payer: Self-pay

## 2019-06-14 ENCOUNTER — Ambulatory Visit (INDEPENDENT_AMBULATORY_CARE_PROVIDER_SITE_OTHER): Payer: Medicare Other | Admitting: Podiatry

## 2019-06-14 ENCOUNTER — Ambulatory Visit (INDEPENDENT_AMBULATORY_CARE_PROVIDER_SITE_OTHER): Payer: Medicare Other

## 2019-06-14 DIAGNOSIS — E1165 Type 2 diabetes mellitus with hyperglycemia: Secondary | ICD-10-CM

## 2019-06-14 DIAGNOSIS — L02619 Cutaneous abscess of unspecified foot: Secondary | ICD-10-CM

## 2019-06-14 DIAGNOSIS — M2041 Other hammer toe(s) (acquired), right foot: Secondary | ICD-10-CM

## 2019-06-14 DIAGNOSIS — E11621 Type 2 diabetes mellitus with foot ulcer: Secondary | ICD-10-CM

## 2019-06-14 DIAGNOSIS — L97519 Non-pressure chronic ulcer of other part of right foot with unspecified severity: Secondary | ICD-10-CM | POA: Diagnosis not present

## 2019-06-14 NOTE — Progress Notes (Signed)
  Subjective:  Patient ID: Johnny Griffin, male    DOB: 05-02-1981,  MRN: [id number]  Chief Complaint  Patient presents with  . Ulcer    F/U Rt 2nd toe ulcer Pt. states," looks a little bit better, but sitll having pain (6/10 sharp constatn pains)." -w/ less redness and swellgin -pt denies drainage -w/ N&V sine last week after taking pain medication Tx: medihoney and sx shoe and clindamycin   . Diabetes    FBS: 8    39 y.o. male presents for wound care. Hx confirmed with patient.States he went to the hospital last week because he was concerned the antibiotic was not helping. States that the toe is now doing better with the antibiotic. Has been feeling ill on and off. Objective:  Physical Exam: Wound Location: Right 2nd toe Wound Measurement: 0.5x0.5 Wound Base: Mixed Granular/Fibrotic Peri-wound: Reddened, Calloused Exudate: Scant/small amount Serosanguinous exudate Wound similar to last visit but with decreasing erythema  Radiographs:  X-ray of the right foot: no fracture, dislocation, swelling or degenerative changes noted and no definite osseous erosion  Assessment:   1. Ulcer of right foot due to type 2 diabetes mellitus (HCC)   2. Hammertoe of right foot   3. Cellulitis and abscess of foot, except toes   4. Uncontrolled type 2 diabetes mellitus with hyperglycemia (HCC)      Plan:  Patient was evaluated and treated and all questions answered.  Right 2nd Toe Hammertoe with ulcer -Repeat XR again without definite osseous changes. -Reviewed labwork and records from the hospital -Order MRI for further review. Likely will benefit from amputation of the digit but will get MRI first for definitive signs of osteomyelitis.  Hx ulcer left 3rd toe -healed s/p tenotomy  Return in about 1 week (around 06/21/2019).

## 2019-06-16 ENCOUNTER — Other Ambulatory Visit: Payer: Self-pay | Admitting: Podiatry

## 2019-06-16 DIAGNOSIS — E11621 Type 2 diabetes mellitus with foot ulcer: Secondary | ICD-10-CM

## 2019-06-16 DIAGNOSIS — L02619 Cutaneous abscess of unspecified foot: Secondary | ICD-10-CM

## 2019-06-21 ENCOUNTER — Ambulatory Visit: Payer: Medicare Other | Admitting: Podiatry

## 2019-06-22 ENCOUNTER — Ambulatory Visit: Payer: Medicare Other | Admitting: Podiatry

## 2019-06-28 ENCOUNTER — Telehealth: Payer: Self-pay | Admitting: *Deleted

## 2019-06-28 DIAGNOSIS — M869 Osteomyelitis, unspecified: Secondary | ICD-10-CM

## 2019-06-28 DIAGNOSIS — Z01812 Encounter for preprocedural laboratory examination: Secondary | ICD-10-CM

## 2019-06-28 NOTE — Telephone Encounter (Signed)
-----   Message from Park Liter, DPM sent at 06/14/2019  3:02 PM EST ----- Can we order MRI at [facility] for Right oot 2nd toe ulcer- r/o Osteomyelitis

## 2019-06-28 NOTE — Telephone Encounter (Signed)
Orders to Dr. Price's current assistant for pre-cert.  

## 2019-06-29 ENCOUNTER — Ambulatory Visit (INDEPENDENT_AMBULATORY_CARE_PROVIDER_SITE_OTHER): Payer: Medicare Other | Admitting: Podiatry

## 2019-06-29 ENCOUNTER — Other Ambulatory Visit: Payer: Self-pay

## 2019-06-29 DIAGNOSIS — L97512 Non-pressure chronic ulcer of other part of right foot with fat layer exposed: Secondary | ICD-10-CM | POA: Diagnosis not present

## 2019-06-29 DIAGNOSIS — E11621 Type 2 diabetes mellitus with foot ulcer: Secondary | ICD-10-CM

## 2019-06-29 NOTE — Progress Notes (Signed)
  Subjective:  Patient ID: Johnny Griffin, male    DOB: 1980-11-02,  MRN: [id number]  Chief Complaint  Patient presents with  . Foot Ulcer    F/U Rt 2nd toe ulcer Pt. states," it feels and looks about the same, actually the toes on my Lt hurts more than my Rt toes." tx: medichoney and bandiad -w/ redness/swellgin and draiange   39 y.o. male presents for wound care.   Objective:  Physical Exam: Wound Location: Right 2nd toe Wound Measurement: 0.3x0.3 Wound Base: Mixed Granular/Fibrotic Peri-wound: Reddened, Calloused Exudate: Scant/small amount Serosanguinous exudate Wound without probe to bone  Assessment:   1. Diabetic ulcer of toe of right foot associated with type 2 diabetes mellitus, with fat layer exposed (HCC)     Plan:  Patient was evaluated and treated and all questions answered.  Right 2nd Toe Hammertoe with ulcer -MRI pending -Wound improving. Debrided today as below. No probe to one today  Procedure: Selective Debridement of Wound Rationale: Removal of devitalized tissue from the wound to promote healing.  Pre-Debridement Wound Measurements: 0.3 cm x 0.3 cm x 0.1 cm  Post-Debridement Wound Measurements: same as pre-debridement. Type of Debridement: sharp selective Tissue Removed: Devitalized soft-tissue Dressing: Dry, sterile, compression dressing. Disposition: Patient tolerated procedure well. Patient to return in 1 week for follow-up.   Hx ulcer left 3rd toe -remains healed s/p tenotomy  No follow-ups on file.

## 2019-06-30 NOTE — Telephone Encounter (Signed)
Johnny Griffin, CMA states [employer] requires clinicals to be sent for pre-cert. Faxed to [employer].

## 2019-07-05 ENCOUNTER — Ambulatory Visit (INDEPENDENT_AMBULATORY_CARE_PROVIDER_SITE_OTHER): Payer: Medicare Other | Admitting: Podiatry

## 2019-07-05 ENCOUNTER — Other Ambulatory Visit: Payer: Self-pay

## 2019-07-05 DIAGNOSIS — M869 Osteomyelitis, unspecified: Secondary | ICD-10-CM

## 2019-07-05 DIAGNOSIS — L97512 Non-pressure chronic ulcer of other part of right foot with fat layer exposed: Secondary | ICD-10-CM

## 2019-07-05 DIAGNOSIS — E11621 Type 2 diabetes mellitus with foot ulcer: Secondary | ICD-10-CM

## 2019-07-05 NOTE — Progress Notes (Signed)
  Subjective:  Patient ID: Johnny Griffin, male    DOB: October 23, 1980,  MRN: [id number]  Chief Complaint  Patient presents with  . Ulcer    F/U Rt 2nd toe ulcer pt. states," it looks like it's a little better." -same iwth swellgin and redness Tx: none -pt concern of a skin crack at Lt 5th toe medial side noticed today -pt denies rednes/swellgin Tx: none  . Diabetes    FBS: 51   39 y.o. male presents for wound care. Thinks the toe is looking better.   Objective:  Physical Exam: Wound Location: Right 2nd toe Wound Measurement: 0.3x0.3 Wound Base: Mixed Granular/Fibrotic Peri-wound: Reddened, Calloused Exudate: Scant/small amount Serosanguinous exudate Wound probes deep today but without probe to bone  Assessment:   1. Diabetic ulcer of toe of right foot associated with type 2 diabetes mellitus, with fat layer exposed (HCC)   2. Osteomyelitis of right foot, unspecified type [facility])     Plan:  Patient was evaluated and treated and all questions answered.  Right 2nd Toe Hammertoe with ulcer -MRI pending. Awaiting pre-cert -No signs of acute infection noted.   Hx ulcer left 3rd toe -remains healed s/p tenotomy  No follow-ups on file.

## 2019-07-12 ENCOUNTER — Ambulatory Visit (INDEPENDENT_AMBULATORY_CARE_PROVIDER_SITE_OTHER): Payer: Medicare Other | Admitting: Podiatry

## 2019-07-12 ENCOUNTER — Other Ambulatory Visit: Payer: Self-pay

## 2019-07-12 DIAGNOSIS — L97512 Non-pressure chronic ulcer of other part of right foot with fat layer exposed: Secondary | ICD-10-CM

## 2019-07-12 DIAGNOSIS — E11621 Type 2 diabetes mellitus with foot ulcer: Secondary | ICD-10-CM | POA: Diagnosis not present

## 2019-07-12 NOTE — Progress Notes (Signed)
  Subjective:  Patient ID: Johnny Griffin, male    DOB: 1981/04/10,  MRN: [id number]  Chief Complaint  Patient presents with  . Foot Ulcer    F/U Rt 2nd to ulcer pt. states," looks about the smae to me, it doesn't seem worse." Tx: medihoney and bandiad -4/10 tenderness -pt denies drianage/wamrth -same with redness and swelliing   . Diabetes    FBS: 54   39 y.o. male presents for wound care. Thinks the toe is about the same.   Objective:  Physical Exam: Wound Location: Right 2nd toe Wound Measurement: 0.3x0.3 Wound Base: Mixed Granular/Fibrotic Peri-wound: Calloused Exudate: Scant/small amount Serosanguinous exudate Wound probes deep today but without probe to bone  Assessment:   1. Diabetic ulcer of toe of right foot associated with type 2 diabetes mellitus, with fat layer exposed (HCC)     Plan:  Patient was evaluated and treated and all questions answered.  Right 2nd Toe Hammertoe with ulcer -MRI pending. Awaiting pre-cert -Toe without clinical worsening. F/u in 2 weeks for recheck.  No follow-ups on file.

## 2019-07-14 NOTE — Telephone Encounter (Signed)
[facility] Antony Contras scheduled pt for 07/19/2019 arrive 10:15am, 10:30am imagine, report to outpatient lab center at 9:30am. Faxed orders to [facility].

## 2019-07-14 NOTE — Telephone Encounter (Signed)
[employer] - Provider Line-Maine L.states as of 05/07/2019 BCBS no longer requires prior authorization for MRI [id number], Reference:  [id number].

## 2019-07-26 ENCOUNTER — Ambulatory Visit: Payer: Medicare Other | Admitting: Podiatry

## 2019-07-29 ENCOUNTER — Other Ambulatory Visit: Payer: Self-pay

## 2019-07-29 DIAGNOSIS — M869 Osteomyelitis, unspecified: Secondary | ICD-10-CM

## 2019-08-02 ENCOUNTER — Ambulatory Visit: Payer: Medicare Other | Admitting: Podiatry

## 2019-08-09 ENCOUNTER — Ambulatory Visit (INDEPENDENT_AMBULATORY_CARE_PROVIDER_SITE_OTHER): Payer: Medicare Other | Admitting: Podiatry

## 2019-08-09 ENCOUNTER — Ambulatory Visit: Payer: Self-pay | Admitting: Podiatry

## 2019-08-09 ENCOUNTER — Other Ambulatory Visit: Payer: Self-pay

## 2019-08-09 ENCOUNTER — Telehealth: Payer: Self-pay

## 2019-08-09 ENCOUNTER — Encounter (HOSPITAL_COMMUNITY): Payer: Self-pay | Admitting: Podiatry

## 2019-08-09 DIAGNOSIS — E1165 Type 2 diabetes mellitus with hyperglycemia: Secondary | ICD-10-CM | POA: Diagnosis not present

## 2019-08-09 DIAGNOSIS — M869 Osteomyelitis, unspecified: Secondary | ICD-10-CM

## 2019-08-09 DIAGNOSIS — M2041 Other hammer toe(s) (acquired), right foot: Secondary | ICD-10-CM

## 2019-08-09 DIAGNOSIS — E11621 Type 2 diabetes mellitus with foot ulcer: Secondary | ICD-10-CM

## 2019-08-09 DIAGNOSIS — L97512 Non-pressure chronic ulcer of other part of right foot with fat layer exposed: Secondary | ICD-10-CM

## 2019-08-09 MED ORDER — CLINDAMYCIN HCL 300 MG PO CAPS: 300.0000 mg | ORAL_CAPSULE | Freq: Two times a day (BID) | ORAL | 0 refills | Status: DC

## 2019-08-09 NOTE — Telephone Encounter (Signed)
DOS 08/11/2019  TOE AMPUTATION 2ND RT - 28825  BCBS MEDICARE EFFECTIVE DATE - 05/07/2019    In-Network   Max Per Benefit Period Year-to-Date Remaining     CoInsurance $4200.00 $3680.00     Deductible       Out-Of-Pocket $4200.00 3680.00   Copay $275.00  per  VISITS Coinsurance $4200.00  Benefit Limits: 3680.00 remaining for SERVICE YEAR

## 2019-08-09 NOTE — Progress Notes (Signed)
  Subjective:  Patient ID: Johnny Griffin, male    DOB: [birth year],  MRN: [id number]  Chief Complaint  Patient presents with  . toe check    F/U Rt toe check Pt. states," I can't really tell any difference, looks red, puffy but no drainage ." -pt denies warmth Tx: sx shoe -pt states he havn't applied anything last few days   . Diabetes    FBS: 214 A1C: na    39 y.o. male presents for wound care. Thinks the toe is about the same.   Objective:  Physical Exam: Wound Location: Right 2nd toe Wound Measurement: 0.2x0.2 Wound Base: Mixed Granular/Fibrotic Peri-wound: Calloused Exudate: Scant/small amount Serosanguinous exudate Wound probes deep today but without probe to bone  Assessment:   1. Osteomyelitis of right foot, unspecified type (HCC)   2. Diabetic ulcer of toe of right foot associated with type 2 diabetes mellitus, with fat layer exposed (HCC)   3. Hammertoe of right foot   4. Uncontrolled type 2 diabetes mellitus with hyperglycemia (HCC)   5. Morbid obesity (HCC)     Plan:  Patient was evaluated and treated and all questions answered.  Right 2nd Toe Hammertoe with ulcer -MRI reviewed.  Confirms osteomyelitis -Discussed with patient that due to osteomyelitis of the toe he would benefit from amputation of the toe.  Patient verbalized understanding and agrees to proceed.  We will continue p.o. antibiotics for suppression until procedure.  Plan for procedure this Wednesday -Patient has failed all conservative therapy and wishes to proceed with surgical intervention. All risks, benefits, and alternatives discussed with patient. No guarantees given. Consent reviewed and signed by patient. -Planned procedures: Partial amputation right second toe  No follow-ups on file.

## 2019-08-09 NOTE — H&P (View-Only) (Signed)
  Subjective:  Patient ID: Johnny Griffin, male    DOB: 03/08/1981,  MRN: [id number]  Chief Complaint  Patient presents with  . toe check    F/U Rt toe check Pt. states," I can't really tell any difference, looks red, puffy but no drainage ." -pt denies warmth Tx: sx shoe -pt states he havn't applied anything last few days   . Diabetes    FBS: 214 A1C: na    38 y.o. male presents for wound care. Thinks the toe is about the same.   Objective:  Physical Exam: Wound Location: Right 2nd toe Wound Measurement: 0.2x0.2 Wound Base: Mixed Granular/Fibrotic Peri-wound: Calloused Exudate: Scant/small amount Serosanguinous exudate Wound probes deep today but without probe to bone  Assessment:   1. Osteomyelitis of right foot, unspecified type (HCC)   2. Diabetic ulcer of toe of right foot associated with type 2 diabetes mellitus, with fat layer exposed (HCC)   3. Hammertoe of right foot   4. Uncontrolled type 2 diabetes mellitus with hyperglycemia (HCC)   5. Morbid obesity (HCC)     Plan:  Patient was evaluated and treated and all questions answered.  Right 2nd Toe Hammertoe with ulcer -MRI reviewed.  Confirms osteomyelitis -Discussed with patient that due to osteomyelitis of the toe he would benefit from amputation of the toe.  Patient verbalized understanding and agrees to proceed.  We will continue p.o. antibiotics for suppression until procedure.  Plan for procedure this Wednesday -Patient has failed all conservative therapy and wishes to proceed with surgical intervention. All risks, benefits, and alternatives discussed with patient. No guarantees given. Consent reviewed and signed by patient. -Planned procedures: Partial amputation right second toe  No follow-ups on file. 

## 2019-08-09 NOTE — Progress Notes (Signed)
DUE TO COVID-19 ONLY ONE VISITOR IS ALLOWED TO COME WITH YOU AND STAY IN THE WAITING ROOM ONLY DURING PRE OP AND PROCEDURE DAY OF SURGERY. THE 1 VISITOR MAY VISIT WITH YOU AFTER SURGERY IN YOUR PRIVATE ROOM DURING VISITING HOURS ONLY!  YOU NEED TO HAVE A COVID 19 TEST ON____4/6___ @__240pm_____ , THIS TEST MUST BE DONE BEFORE SURGERY, COME  Johnny Griffin , [id number].  ([place]) ONCE YOUR COVID TEST IS COMPLETED, PLEASE BEGIN THE QUARANTINE INSTRUCTIONS AS OUTLINED IN YOUR HANDOUT.                Johnny Griffin  08/09/2019   Your procedure is scheduled on:  08/11/19   Report to [facility] Main  Entrance   Report to admitting at     100pm     Call this number if you have problems the morning of surgery [phone]    Remember: Do not eat food o :After Midnight. May have clear liquids until 1230pm day of surgery  BRUSH YOUR TEETH MORNING OF SURGERY AND RINSE YOUR MOUTH OUT, NO CHEWING GUM CANDY OR MINTS.     Take these medicines the morning of surgery with A SIP OF WATER:  Xanax if needed, , Gabapentin, , Paxil  DO NOT TAKE ANY DIABETIC MEDICATIONS DAY OF YOUR SURGERY                               You may not have any metal on your body including hair pins and              piercings  Do not wear jewelry, make-up, lotions, powders or perfumes, deodorant             Do not wear nail polish on your fingernails.  Do not shave  48 hours prior to surgery.              Men may shave face and neck.   Do not bring valuables to the hospital. [place].  Contacts, dentures or bridgework may not be worn into surgery.  Leave suitcase in the car. After surgery it may be brought to your room.     Patients discharged the day of surgery will not be allowed to drive home. IF YOU ARE HAVING SURGERY AND GOING HOME THE SAME DAY, YOU MUST HAVE AN ADULT TO DRIVE YOU HOME AND BE WITH YOU FOR 24 HOURS. YOU MAY GO HOME  BY TAXI OR UBER OR ORTHERWISE, BUT AN ADULT MUST ACCOMPANY YOU HOME AND STAY WITH YOU FOR 24 HOURS.  Name and phone number of your driver: uncle Hurshell Dino [phone]  Special Instructions:               Please read over the following fact sheets you were given: _____________________________________________________________________             NO SOLID FOOD AFTER MIDNIGHT THE NIGHT PRIOR TO SURGERY. NOTHING BY MOUTH EXCEPT CLEAR LIQUIDS UNTIL  1230pm . PLEASE FINISH G2  DRINK PER SURGEON ORDER  WHICH NEEDS TO BE COMPLETED AT 1230pm.   CLEAR LIQUID DIET   Foods Allowed  Foods Excluded  Coffee and tea, regular and decaf                             liquids that you cannot  Plain Jell-O any favor except red or purple                                           see through such as: Fruit ices (not with fruit pulp)                                     milk, soups, orange juice  Iced Popsicles                                    All solid food Carbonated beverages, regular and diet                                    Cranberry, grape and apple juices Sports drinks like Gatorade Lightly seasoned clear broth or consume(fat free) Sugar, honey syrup  Sample Menu Breakfast                                Lunch                                     Supper Cranberry juice                    Beef broth                            Chicken broth Jell-O                                     Grape juice                           Apple juice Coffee or tea                        Jell-O                                      Popsicle                                                Coffee or tea                        Coffee or tea  _____________________________________________________________________  [facility] - Preparing for Surgery Before surgery, you can play an important role.  Because skin is not sterile, your skin  needs to be as free  of germs as possible.  You can reduce the number of germs on your skin by washing with CHG (chlorahexidine gluconate) soap before surgery.  CHG is an antiseptic cleaner which kills germs and bonds with the skin to continue killing germs even after washing. Please DO NOT use if you have an allergy to CHG or antibacterial soaps.  If your skin becomes reddened/irritated stop using the CHG and inform your nurse when you arrive at Short Stay. Do not shave (including legs and underarms) for at least 48 hours prior to the first CHG shower.  You may shave your face/neck. Please follow these instructions carefully:  1.  Shower with CHG Soap the night before surgery and the  morning of Surgery.  2.  If you choose to wash your hair, wash your hair first as usual with your  normal  shampoo.  3.  After you shampoo, rinse your hair and body thoroughly to remove the  shampoo.                           4.  Use CHG as you would any other liquid soap.  You can apply chg directly  to the skin and wash                       Gently with a scrungie or clean washcloth.  5.  Apply the CHG Soap to your body ONLY FROM THE NECK DOWN.   Do not use on face/ open                           Wound or open sores. Avoid contact with eyes, ears mouth and genitals (private parts).                       Wash face,  Genitals (private parts) with your normal soap.             6.  Wash thoroughly, paying special attention to the area where your surgery  will be performed.  7.  Thoroughly rinse your body with warm water from the neck down.  8.  DO NOT shower/wash with your normal soap after using and rinsing off  the CHG Soap.                9.  Pat yourself dry with a clean towel.            10.  Wear clean pajamas.            11.  Place clean sheets on your bed the night of your first shower and do not  sleep with pets. Day of Surgery : Do not apply any lotions/deodorants the morning of surgery.  Please wear clean clothes to the  hospital/surgery center.  FAILURE TO FOLLOW THESE INSTRUCTIONS MAY RESULT IN THE CANCELLATION OF YOUR SURGERY PATIENT SIGNATURE_________________________________  NURSE SIGNATURE__________________________________  ________________________________________________________________________

## 2019-08-09 NOTE — Patient Instructions (Signed)
Pre-Operative Instructions  Congratulations, you have decided to take an important step towards improving your quality of life.  You can be assured that the doctors and staff at [facility] will be with you every step of the way.  Here are some important things you should know:  1. Plan to be at the surgery center/hospital at least 1 (one) hour prior to your scheduled time, unless otherwise directed by the surgical center/hospital staff.  You must have a responsible adult accompany you, remain during the surgery and drive you home.  Make sure you have directions to the surgical center/hospital to ensure you arrive on time. 2. If you are having surgery at [facility] or [facility] hospitals, you will need a copy of your medical history and physical form from your family physician within one month prior to the date of surgery. We will give you a form for your primary physician to complete.  3. We make every effort to accommodate the date you request for surgery.  However, there are times where surgery dates or times have to be moved.  We will contact you as soon as possible if a change in schedule is required.   4. No aspirin/ibuprofen for one week before surgery.  If you are on aspirin, any non-steroidal anti-inflammatory medications (Mobic, Aleve, Ibuprofen) should not be taken seven (7) days prior to your surgery.  You make take Tylenol for pain prior to surgery.  5. Medications - If you are taking daily heart and blood pressure medications, seizure, reflux, allergy, asthma, anxiety, pain or diabetes medications, make sure you notify the surgery center/hospital before the day of surgery so they can tell you which medications you should take or avoid the day of surgery. 6. No food or drink after midnight the night before surgery unless directed otherwise by surgical center/hospital staff. 7. No alcoholic beverages 24-hours prior to surgery.  No smoking 24-hours prior or 24-hours after  surgery. 8. Wear loose pants or shorts. They should be loose enough to fit over bandages, boots, and casts. 9. Don't wear slip-on shoes. Sneakers are preferred. 10. Bring your boot with you to the surgery center/hospital.  Also bring crutches or a walker if your physician has prescribed it for you.  If you do not have this equipment, it will be provided for you after surgery. 11. If you have not been contacted by the surgery center/hospital by the day before your surgery, call to confirm the date and time of your surgery. 12. Leave-time from work may vary depending on the type of surgery you have.  Appropriate arrangements should be made prior to surgery with your employer. 13. Prescriptions will be provided immediately following surgery by your doctor.  Fill these as soon as possible after surgery and take the medication as directed. Pain medications will not be refilled on weekends and must be approved by the doctor. 14. Remove nail polish on the operative foot and avoid getting pedicures prior to surgery. 15. Wash the night before surgery.  The night before surgery wash the foot and leg well with water and the antibacterial soap provided. Be sure to pay special attention to beneath the toenails and in between the toes.  Wash for at least three (3) minutes. Rinse thoroughly with water and dry well with a towel.  Perform this wash unless told not to do so by your physician.  Enclosed: 1 Ice pack (please put in freezer the night before surgery)   1 Hibiclens skin cleaner     Pre-op instructions  If you have any questions regarding the instructions, please do not hesitate to call our office.  [place]: [address], [place], [place] [address] -- [phone]  [place]: [address] [place], [place] [address] -- [phone]  [place]: [address], [place], [place] [address] -- [phone]   Website: [email or web address] 

## 2019-08-10 ENCOUNTER — Other Ambulatory Visit (HOSPITAL_COMMUNITY): Payer: Medicare Other

## 2019-08-10 ENCOUNTER — Other Ambulatory Visit: Payer: Self-pay

## 2019-08-10 ENCOUNTER — Encounter (HOSPITAL_COMMUNITY)
Admission: RE | Admit: 2019-08-10 | Discharge: 2019-08-10 | Disposition: A | Discharge status: Home / Self Care | Payer: Medicare Other | Source: Ambulatory Visit | Attending: Podiatry | Admitting: Podiatry

## 2019-08-10 DIAGNOSIS — L97512 Non-pressure chronic ulcer of other part of right foot with fat layer exposed: Secondary | ICD-10-CM | POA: Diagnosis not present

## 2019-08-10 DIAGNOSIS — E11621 Type 2 diabetes mellitus with foot ulcer: Secondary | ICD-10-CM | POA: Diagnosis not present

## 2019-08-10 DIAGNOSIS — Z7984 Long term (current) use of oral hypoglycemic drugs: Secondary | ICD-10-CM | POA: Diagnosis not present

## 2019-08-10 DIAGNOSIS — M869 Osteomyelitis, unspecified: Secondary | ICD-10-CM | POA: Diagnosis not present

## 2019-08-10 DIAGNOSIS — Z01818 Encounter for other preprocedural examination: Secondary | ICD-10-CM | POA: Diagnosis present

## 2019-08-10 LAB — BASIC METABOLIC PANEL
Anion gap: 10 (ref 5–15)
BUN: 14 mg/dL (ref 6–20)
CO2: 26 mmol/L (ref 22–32)
Calcium: 9.1 mg/dL (ref 8.9–10.3)
Chloride: 98 mmol/L (ref 98–111)
Creatinine, Ser: 0.94 mg/dL (ref 0.61–1.24)
GFR calc Af Amer: >60 mL/min (ref >60)
GFR calc non Af Amer: >60 mL/min (ref >60)
Glucose, Bld: 336 mg/dL — ABNORMAL HIGH (ref 70–99)
Potassium: 4.3 mmol/L (ref 3.5–5.1)
Sodium: 134 mmol/L — ABNORMAL LOW (ref 135–145)

## 2019-08-10 LAB — CBC
HCT: 50 % (ref 39.0–52.0)
Hemoglobin: 17 g/dL (ref 13.0–17.0)
MCH: 30 pg (ref 26.0–34.0)
MCHC: 34 g/dL (ref 30.0–36.0)
MCV: 88.3 fL (ref 80.0–100.0)
Platelets: 293 10*3/uL (ref 150–400)
RBC: 5.66 MIL/uL (ref 4.22–5.81)
RDW: 12.3 % (ref 11.5–15.5)
WBC: 8.3 10*3/uL (ref 4.0–10.5)
nRBC: 0 % (ref 0.0–0.2)

## 2019-08-10 MED ORDER — DEXTROSE 5 % IV SOLN
3.0000 g | INTRAVENOUS | Status: AC
  Administered 2019-08-11: 3 g via INTRAVENOUS
  Filled 2019-08-10: qty 3

## 2019-08-10 NOTE — Progress Notes (Signed)
Attemtped x 3 to obtain labs from [facility] .  Lab results from 08/09/19 are not yet resulted.   Called and left patient a voice mail message to remind him to come to [facility] at 200pm on 4/6 for labs and covid test at 240pm at [facility] .  Left in the message that I was unable to obtain labs from [facility].

## 2019-08-11 ENCOUNTER — Ambulatory Visit (HOSPITAL_COMMUNITY): Payer: Medicare Other | Admitting: Physician Assistant

## 2019-08-11 ENCOUNTER — Encounter: Payer: Self-pay | Admitting: Podiatry

## 2019-08-11 ENCOUNTER — Encounter (HOSPITAL_COMMUNITY)
Admission: RE | Disposition: A | Discharge status: Home / Self Care | Payer: Self-pay | Source: Home / Self Care | Attending: Podiatry

## 2019-08-11 ENCOUNTER — Ambulatory Visit (HOSPITAL_COMMUNITY)
Admission: RE | Admit: 2019-08-11 | Discharge: 2019-08-11 | Disposition: A | Discharge status: Home / Self Care | Payer: Medicare Other | Attending: Podiatry | Admitting: Podiatry

## 2019-08-11 ENCOUNTER — Encounter (HOSPITAL_COMMUNITY): Payer: Self-pay | Admitting: Podiatry

## 2019-08-11 DIAGNOSIS — L97522 Non-pressure chronic ulcer of other part of left foot with fat layer exposed: Secondary | ICD-10-CM | POA: Insufficient documentation

## 2019-08-11 DIAGNOSIS — Z6841 Body Mass Index (BMI) 40.0 and over, adult: Secondary | ICD-10-CM | POA: Diagnosis not present

## 2019-08-11 DIAGNOSIS — I10 Essential (primary) hypertension: Secondary | ICD-10-CM | POA: Insufficient documentation

## 2019-08-11 DIAGNOSIS — M86671 Other chronic osteomyelitis, right ankle and foot: Secondary | ICD-10-CM | POA: Insufficient documentation

## 2019-08-11 DIAGNOSIS — Z7984 Long term (current) use of oral hypoglycemic drugs: Secondary | ICD-10-CM | POA: Diagnosis not present

## 2019-08-11 DIAGNOSIS — E1165 Type 2 diabetes mellitus with hyperglycemia: Secondary | ICD-10-CM | POA: Insufficient documentation

## 2019-08-11 DIAGNOSIS — M2041 Other hammer toe(s) (acquired), right foot: Secondary | ICD-10-CM | POA: Diagnosis not present

## 2019-08-11 DIAGNOSIS — M869 Osteomyelitis, unspecified: Secondary | ICD-10-CM | POA: Diagnosis not present

## 2019-08-11 DIAGNOSIS — E1169 Type 2 diabetes mellitus with other specified complication: Secondary | ICD-10-CM | POA: Diagnosis not present

## 2019-08-11 DIAGNOSIS — Z87891 Personal history of nicotine dependence: Secondary | ICD-10-CM | POA: Diagnosis not present

## 2019-08-11 DIAGNOSIS — Z20822 Contact with and (suspected) exposure to covid-19: Secondary | ICD-10-CM | POA: Diagnosis not present

## 2019-08-11 DIAGNOSIS — E11621 Type 2 diabetes mellitus with foot ulcer: Secondary | ICD-10-CM | POA: Insufficient documentation

## 2019-08-11 DIAGNOSIS — G4730 Sleep apnea, unspecified: Secondary | ICD-10-CM | POA: Diagnosis not present

## 2019-08-11 HISTORY — DX: Type 2 diabetes mellitus without complications: E11.9

## 2019-08-11 HISTORY — DX: Essential (primary) hypertension: I10

## 2019-08-11 HISTORY — PX: AMPUTATION TOE: SHX6595

## 2019-08-11 HISTORY — DX: Peripheral vascular disease, unspecified: I73.9

## 2019-08-11 HISTORY — DX: Sleep apnea, unspecified: G47.30

## 2019-08-11 HISTORY — DX: Depression, unspecified: F32.A

## 2019-08-11 LAB — HEMOGLOBIN A1C
Hgb A1c MFr Bld: 10.4 % — ABNORMAL HIGH (ref 4.8–5.6)
Mean Plasma Glucose: 252 mg/dL

## 2019-08-11 LAB — RESPIRATORY PANEL BY RT PCR (FLU A&B, COVID)
Influenza A by PCR: NEGATIVE
Influenza B by PCR: NEGATIVE
SARS Coronavirus 2 by RT PCR: NEGATIVE

## 2019-08-11 LAB — GLUCOSE, CAPILLARY
Glucose-Capillary: 290 mg/dL — ABNORMAL HIGH (ref 70–99)
Glucose-Capillary: 275 mg/dL — ABNORMAL HIGH (ref 70–99)

## 2019-08-11 SURGERY — AMPUTATION, TOE
Anesthesia: Monitor Anesthesia Care | Site: Toe | Laterality: Right

## 2019-08-11 MED ORDER — LIDOCAINE HCL (CARDIAC) PF 100 MG/5ML IV SOSY
PREFILLED_SYRINGE | INTRAVENOUS | Status: DC | PRN
  Administered 2019-08-11: 100 mg via INTRATRACHEAL

## 2019-08-11 MED ORDER — ONDANSETRON HCL 4 MG/2ML IJ SOLN
INTRAMUSCULAR | Status: DC | PRN
  Administered 2019-08-11: 4 mg via INTRAVENOUS

## 2019-08-11 MED ORDER — BUPIVACAINE HCL (PF) 0.5 % IJ SOLN
INTRAMUSCULAR | Status: DC | PRN
  Administered 2019-08-11: 10 mL

## 2019-08-11 MED ORDER — PHENYLEPHRINE 40 MCG/ML (10ML) SYRINGE FOR IV PUSH (FOR BLOOD PRESSURE SUPPORT)
PREFILLED_SYRINGE | INTRAVENOUS | Status: AC
  Filled 2019-08-11: qty 10

## 2019-08-11 MED ORDER — PROPOFOL 500 MG/50ML IV EMUL
INTRAVENOUS | Status: DC | PRN
  Administered 2019-08-11: 125 ug/kg/min via INTRAVENOUS

## 2019-08-11 MED ORDER — PROPOFOL 500 MG/50ML IV EMUL
INTRAVENOUS | Status: DC | PRN
  Administered 2019-08-11: 30 mg via INTRAVENOUS
  Administered 2019-08-11: 20 mg via INTRAVENOUS

## 2019-08-11 MED ORDER — EPHEDRINE 5 MG/ML INJ
INTRAVENOUS | Status: AC
  Filled 2019-08-11: qty 10

## 2019-08-11 MED ORDER — BUPIVACAINE HCL (PF) 0.5 % IJ SOLN
INTRAMUSCULAR | Status: AC
  Filled 2019-08-11: qty 30

## 2019-08-11 MED ORDER — LACTATED RINGERS IV SOLN: INTRAVENOUS | Status: DC | PRN

## 2019-08-11 MED ORDER — CELECOXIB 200 MG PO CAPS
200.0000 mg | ORAL_CAPSULE | Freq: Once | ORAL | Status: AC
  Administered 2019-08-11: 14:00:00 200 mg via ORAL
  Filled 2019-08-11: qty 1

## 2019-08-11 MED ORDER — MIDAZOLAM HCL 2 MG/2ML IJ SOLN
INTRAMUSCULAR | Status: AC
  Filled 2019-08-11: qty 2

## 2019-08-11 MED ORDER — FENTANYL CITRATE (PF) 100 MCG/2ML IJ SOLN
INTRAMUSCULAR | Status: AC
  Filled 2019-08-11: qty 2

## 2019-08-11 MED ORDER — MIDAZOLAM HCL 5 MG/5ML IJ SOLN
INTRAMUSCULAR | Status: DC | PRN
  Administered 2019-08-11: 1 mg via INTRAVENOUS

## 2019-08-11 MED ORDER — FENTANYL CITRATE (PF) 100 MCG/2ML IJ SOLN: 25.0000 ug | INTRAMUSCULAR | Status: DC | PRN

## 2019-08-11 MED ORDER — FENTANYL CITRATE (PF) 100 MCG/2ML IJ SOLN
INTRAMUSCULAR | Status: DC | PRN
  Administered 2019-08-11: 25 ug via INTRAVENOUS

## 2019-08-11 MED ORDER — ACETAMINOPHEN 500 MG PO TABS
1000.0000 mg | ORAL_TABLET | Freq: Once | ORAL | Status: AC
  Administered 2019-08-11: 14:00:00 1000 mg via ORAL
  Filled 2019-08-11: qty 2

## 2019-08-11 MED ORDER — HYDROCODONE-ACETAMINOPHEN 10-325 MG PO TABS: 1.0000 | ORAL_TABLET | Freq: Four times a day (QID) | ORAL | 0 refills | Status: DC | PRN

## 2019-08-11 MED ORDER — CEPHALEXIN 500 MG PO CAPS: 500.0000 mg | ORAL_CAPSULE | Freq: Two times a day (BID) | ORAL | 0 refills | Status: AC

## 2019-08-11 MED ORDER — PROMETHAZINE HCL 25 MG/ML IJ SOLN: 6.2500 mg | INTRAMUSCULAR | Status: DC | PRN

## 2019-08-11 MED ORDER — LACTATED RINGERS IV SOLN: INTRAVENOUS | Status: DC

## 2019-08-11 MED ORDER — 0.9 % SODIUM CHLORIDE (POUR BTL) OPTIME
TOPICAL | Status: DC | PRN
  Administered 2019-08-11: 1000 mL

## 2019-08-11 SURGICAL SUPPLY — 40 items
BLADE SURG 15 STRL LF DISP TIS (BLADE) ×1 IMPLANT
BLADE SURG 15 STRL SS (BLADE) ×1
BNDG ELASTIC 4X5.8 VLCR STR LF (GAUZE/BANDAGES/DRESSINGS) ×3 IMPLANT
BNDG ELASTIC 6X5.8 VLCR STR LF (GAUZE/BANDAGES/DRESSINGS) ×2 IMPLANT
BNDG GAUZE ELAST 4 BULKY (GAUZE/BANDAGES/DRESSINGS) ×1 IMPLANT
CLEANER TIP ELECTROSURG 2X2 (MISCELLANEOUS) ×2 IMPLANT
CNTNR URN SCR LID CUP LEK RST (MISCELLANEOUS) ×1 IMPLANT
CONT SPEC 4OZ STRL OR WHT (MISCELLANEOUS) ×1
COVER MAYO STAND STRL (DRAPES) ×2 IMPLANT
COVER SURGICAL LIGHT HANDLE (MISCELLANEOUS) ×2 IMPLANT
COVER WAND RF STERILE (DRAPES) IMPLANT
CUFF TOURN SGL QUICK 24 (TOURNIQUET CUFF) ×1
CUFF TRNQT CYL 24X4X16.5-23 (TOURNIQUET CUFF) ×1 IMPLANT
DECANTER SPIKE VIAL GLASS SM (MISCELLANEOUS) ×2 IMPLANT
GAUZE SPONGE 4X4 12PLY STRL (GAUZE/BANDAGES/DRESSINGS) ×4 IMPLANT
GAUZE XEROFORM 1X8 LF (GAUZE/BANDAGES/DRESSINGS) ×1 IMPLANT
GLOVE BIO SURGEON STRL SZ7.5 (GLOVE) ×2 IMPLANT
GLOVE BIOGEL PI IND STRL 8 (GLOVE) ×1 IMPLANT
GLOVE BIOGEL PI INDICATOR 8 (GLOVE) ×1
GOWN STRL REUS W/TWL XL LVL3 (GOWN DISPOSABLE) ×2 IMPLANT
KIT BASIN OR (CUSTOM PROCEDURE TRAY) ×2 IMPLANT
KIT TURNOVER KIT A (KITS) IMPLANT
MARKER SKIN DUAL TIP RULER LAB (MISCELLANEOUS) ×2 IMPLANT
NDL HYPO 25X1 1.5 SAFETY (NEEDLE) ×1 IMPLANT
NEEDLE HYPO 25X1 1.5 SAFETY (NEEDLE) ×2 IMPLANT
PACK ORTHO EXTREMITY (CUSTOM PROCEDURE TRAY) ×2 IMPLANT
PADDING UNDERCAST 2 STRL (CAST SUPPLIES) ×1
PADDING UNDERCAST 2X4 STRL (CAST SUPPLIES) ×1 IMPLANT
PENCIL SMOKE EVACUATOR (MISCELLANEOUS) IMPLANT
SUT ETHILON 4 0 PS 2 18 (SUTURE) ×1 IMPLANT
SUT PROLENE 4 0 PS 2 18 (SUTURE) ×1 IMPLANT
SUT VIC AB 1 CT1 27 (SUTURE)
SUT VIC AB 1 CT1 27XBRD ANTBC (SUTURE) ×1 IMPLANT
SUT VIC AB 3-0 PS2 18 (SUTURE) ×1
SUT VIC AB 3-0 PS2 18XBRD (SUTURE) IMPLANT
SYR 20ML LL LF (SYRINGE) ×2 IMPLANT
TOWEL OR 17X26 10 PK STRL BLUE (TOWEL DISPOSABLE) ×2 IMPLANT
TOWEL OR NON WOVEN STRL DISP B (DISPOSABLE) ×2 IMPLANT
TRAY PREP A LATEX SAFE STRL (SET/KITS/TRAYS/PACK) ×2 IMPLANT
UNDERPAD 30X36 HEAVY ABSORB (UNDERPADS AND DIAPERS) ×8 IMPLANT

## 2019-08-11 NOTE — Transfer of Care (Signed)
Immediate Anesthesia Transfer of Care Note  Patient: Johnny Griffin  Procedure(s) Performed: PARTIAL AMPUTATION RIGHT 2nd TOE (Right Toe)  Patient Location: PACU  Anesthesia Type:MAC  Level of Consciousness: awake, oriented and patient cooperative  Airway & Oxygen Therapy: Patient Spontanous Breathing and Patient connected to face mask oxygen  Post-op Assessment: Report given to RN and Post -op Vital signs reviewed and stable  Post vital signs: Reviewed and stable  Last Vitals:  Vitals Value Taken Time  BP 184/96 08/11/19 1625  Temp    Pulse 93 08/11/19 1626  Resp 13 08/11/19 1626  SpO2 100 % 08/11/19 1626  Vitals shown include unvalidated device data.  Last Pain:  Vitals:   08/11/19 1409  TempSrc:   PainSc: 0-No pain         Complications: No apparent anesthesia complications

## 2019-08-11 NOTE — Anesthesia Postprocedure Evaluation (Signed)
Anesthesia Post Note  Patient: Johnny Griffin  Procedure(s) Performed: PARTIAL AMPUTATION RIGHT 2nd TOE (Right Toe)     Patient location during evaluation: PACU Anesthesia Type: MAC Level of consciousness: awake and alert, oriented and patient cooperative Pain management: pain level controlled Vital Signs Assessment: post-procedure vital signs reviewed and stable Respiratory status: spontaneous breathing, nonlabored ventilation and respiratory function stable Cardiovascular status: blood pressure returned to baseline and stable Postop Assessment: no apparent nausea or vomiting Anesthetic complications: no    Last Vitals:  Vitals:   08/11/19 1630 08/11/19 1645  BP: (!) 129/95 119/80  Pulse: 91 87  Resp: 12 12  Temp: 36.8 C   SpO2: 100% 100%    Last Pain:  Vitals:   08/11/19 1645  TempSrc:   PainSc: 0-No pain                 Shauntia Levengood,E. Genine Beckett

## 2019-08-11 NOTE — Discharge Instructions (Signed)
Post-Surgery Instructions  1. If you are recuperating from surgery anywhere other than home, please be sure to leave us a number where you can be reached. 2. Go directly home and rest. 3. The keep operated foot (or feet) elevated six inches above the hip when sitting or lying down. 4. Support the elevated foot and leg with pillows under the calf. DO NOT PLACE PILLOWS UNDER THE KNEE. 5. DO NOT REMOVE or get your bandages wet. This will increase your chances of getting an infection. 6. Wear your surgical shoe at all times when you are up. 7. A limited amount of pain and swelling may occur. The skin may take on a bruised appearance. This is no cause for alarm. 8. For slight pain and swelling, apply an ice pack directly over the bandage for 15 minutes every hour. Continue icing until seen in the office. DO NOT apply any form of heat to the area. 9. Have prescription(s) filled immediately and take as directed. 10. Drink lots of liquids, water, and juice. 11. CALL THE OFFICE IMMEDIATELY IF: a. Bleeding continues b. Pain increases and/or does not respond to medication c. Bandage or cast appears too tight d. Any liquids (water, coffee, etc.) have spilled on your bandages. e. Tripping, falling, or stubbing the surgical foot f. If your temperature rises above 101 g. If you have ANY questions at all 12.  You are expected to be: weight-bearing  If you need to reach the nurse for any reason, please call: /[place]: [phone] [place]: [phone] [place]: [phone]  

## 2019-08-11 NOTE — Anesthesia Preprocedure Evaluation (Addendum)
Anesthesia Evaluation  Patient identified by MRN, date of birth, ID band Patient awake    Reviewed: Allergy & Precautions, NPO status , Patient's Chart, lab work & pertinent test results  History of Anesthesia Complications Negative for: history of anesthetic complications  Airway Mallampati: III  TM Distance: >3 FB Neck ROM: Full    Dental no notable dental hx. (+) Dental Advisory Given   Pulmonary sleep apnea and Continuous Positive Airway Pressure Ventilation , former smoker,    Pulmonary exam normal        Cardiovascular hypertension, Pt. on medications Normal cardiovascular exam     Neuro/Psych PSYCHIATRIC DISORDERS Anxiety Depression negative neurological ROS     GI/Hepatic Neg liver ROS, GERD  ,  Endo/Other  diabetes, Type 2, Oral Hypoglycemic AgentsMorbid obesity  Renal/GU negative Renal ROS  negative genitourinary   Musculoskeletal negative musculoskeletal ROS (+)   Abdominal   Peds negative pediatric ROS (+)  Hematology negative hematology ROS (+)   Anesthesia Other Findings   Reproductive/Obstetrics negative OB ROS                           Anesthesia Physical Anesthesia Plan  ASA: III  Anesthesia Plan: MAC   Post-op Pain Management:    Induction: Intravenous  PONV Risk Score and Plan: 1 and Ondansetron and Propofol infusion  Airway Management Planned: Natural Airway, Simple Face Mask and Nasal Cannula  Additional Equipment:   Intra-op Plan:   Post-operative Plan:   Informed Consent: I have reviewed the patients History and Physical, chart, labs and discussed the procedure including the risks, benefits and alternatives for the proposed anesthesia with the patient or authorized representative who has indicated his/her understanding and acceptance.     Dental advisory given  Plan Discussed with: CRNA and Anesthesiologist  Anesthesia Plan Comments:          Anesthesia Quick Evaluation

## 2019-08-11 NOTE — Op Note (Signed)
Patient Name: Johnny Griffin DOB: Sep 09, 1980  MRN: [id number]   Date of Service: 08/11/2019  Surgeon: Dr. Hardie Pulley, DPM Assistants: None Pre-operative Diagnosis:   Osteomyelitis Post-operative Diagnosis:  Same Procedures:  1) Partial toe amputation right 2nd toe Pathology/Specimens: ID Type Source Tests Collected by Time Destination  1 : Right 2nd toe amputation Tissue PATH Soft tissue resection SURGICAL PATHOLOGY Evelina Bucy, DPM 08/11/2019 1551    Anesthesia: MAC/local Hemostasis: * No tourniquets in log * Estimated Blood Loss: 5 ml Materials: * No implants in log * Medications: None Complications: none  Indications for Procedure:  This is a 39 y.o. 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 All r/b/a discussed.   Procedure in Detail: Patient was identified in pre-operative holding area. Formal consent was signed and the right lower extremity was marked. Patient was brought back to the operating room. Anesthesia was induced. The extremity was prepped and draped in the usual sterile fashion. Timeout was taken to confirm patient name, laterality, and procedure prior to incision.   Attention was then directed to the right second toe where an incision was made distal to the PIPJ. Dissection was carried down to level of bone.  Dissection was continued to the PIPJ joint and all collateral ligaments were freed at the joint.  The bone soft tissue attachments of the proximal phalanx were removed and passed for pathology.  The remaining phalangeal head appeared healthy and viable.  The area was copiously irrigated.  The skin was reapproximated with nylon and skin staples.    Disposition: Following a period of post-operative monitoring, patient will be transferred home.

## 2019-08-11 NOTE — Interval H&P Note (Signed)
History and Physical Interval Note:  08/11/2019 3:01 PM  Johnny Griffin  has presented today for surgery, with the diagnosis of Osteomyelitis.  The various methods of treatment have been discussed with the patient and family. After consideration of risks, benefits and other options for treatment, the patient has consented to  Procedure(s): PARTIAL AMPUTATION RIGHT 2nd TOE (Right) as a surgical intervention.  The patient's history has been reviewed, patient examined, no change in status, stable for surgery.  I have reviewed the patient's chart and labs.  Questions were answered to the patient's satisfaction.     [place]

## 2019-08-12 ENCOUNTER — Telehealth: Payer: Self-pay

## 2019-08-12 NOTE — Telephone Encounter (Signed)
POST OP CALL-    1) General condition stated by the patient: Doing okay  2) Is the pt having pain? Slight soreness  3) Pain score:   4) Has the pt taken Rx'd pain medication, regularly or PRN?   5) Is the pain medication giving relief?  6) Any fever, chills, nausea, or vomiting, shortness of breath or tightness in calf? None  7) Is the bandage clean, dry and intact? Pt states he has kept foot in the boot.  8) Is there excessive tightness, bleeding or drainage coming through the bandage? None  9) Did you understand all of the post op instruction sheet given? Yes  10) Any questions or concerns regarding post op care/recovery? No    Confirmed POV appointment with patient

## 2019-08-13 LAB — SURGICAL PATHOLOGY

## 2019-08-16 ENCOUNTER — Other Ambulatory Visit: Payer: Self-pay | Admitting: Podiatry

## 2019-08-16 DIAGNOSIS — Z9889 Other specified postprocedural states: Secondary | ICD-10-CM

## 2019-08-17 ENCOUNTER — Ambulatory Visit (INDEPENDENT_AMBULATORY_CARE_PROVIDER_SITE_OTHER): Payer: Medicare Other

## 2019-08-17 ENCOUNTER — Other Ambulatory Visit: Payer: Self-pay

## 2019-08-17 ENCOUNTER — Ambulatory Visit (INDEPENDENT_AMBULATORY_CARE_PROVIDER_SITE_OTHER): Payer: Medicare Other | Admitting: Podiatry

## 2019-08-17 DIAGNOSIS — L97512 Non-pressure chronic ulcer of other part of right foot with fat layer exposed: Secondary | ICD-10-CM | POA: Diagnosis not present

## 2019-08-17 DIAGNOSIS — M869 Osteomyelitis, unspecified: Secondary | ICD-10-CM

## 2019-08-17 DIAGNOSIS — E11621 Type 2 diabetes mellitus with foot ulcer: Secondary | ICD-10-CM | POA: Diagnosis not present

## 2019-08-17 DIAGNOSIS — Z9889 Other specified postprocedural states: Secondary | ICD-10-CM

## 2019-08-17 MED ORDER — HYDROCODONE-ACETAMINOPHEN 10-325 MG PO TABS: 1.0000 | ORAL_TABLET | Freq: Four times a day (QID) | ORAL | 0 refills | Status: AC | PRN

## 2019-08-20 ENCOUNTER — Other Ambulatory Visit: Payer: Self-pay | Admitting: Podiatry

## 2019-08-20 DIAGNOSIS — M869 Osteomyelitis, unspecified: Secondary | ICD-10-CM

## 2019-08-20 DIAGNOSIS — Z9889 Other specified postprocedural states: Secondary | ICD-10-CM

## 2019-08-24 ENCOUNTER — Other Ambulatory Visit: Payer: Self-pay

## 2019-08-24 ENCOUNTER — Ambulatory Visit (INDEPENDENT_AMBULATORY_CARE_PROVIDER_SITE_OTHER): Payer: Medicare Other | Admitting: Podiatry

## 2019-08-24 DIAGNOSIS — Z9889 Other specified postprocedural states: Secondary | ICD-10-CM

## 2019-08-24 DIAGNOSIS — E11621 Type 2 diabetes mellitus with foot ulcer: Secondary | ICD-10-CM

## 2019-08-24 DIAGNOSIS — L97512 Non-pressure chronic ulcer of other part of right foot with fat layer exposed: Secondary | ICD-10-CM

## 2019-08-24 MED ORDER — ACETAMINOPHEN-CODEINE #3 300-30 MG PO TABS: 1.0000 | ORAL_TABLET | Freq: Four times a day (QID) | ORAL | 0 refills | Status: AC | PRN

## 2019-08-24 NOTE — Addendum Note (Signed)
Addended by: Ventura Sellers on: 08/24/2019 11:44 AM   Modules accepted: Orders

## 2019-08-24 NOTE — Progress Notes (Signed)
  Subjective:  Patient ID: Johnny Griffin, male    DOB: 01-Oct-1980,  MRN: [id number]  Chief Complaint  Patient presents with  . Routine Post Op    POV#2 pt. states,' it sitll hurts a little bit; 5/10 -pt dneis N/V/F?Ch tx: sx shoe, and elevatoin -dressing very loose and coming off    DOS: 08/11/19 Procedure: Partial amputation right 2nd toe  39 y.o. male presents with the above complaint. History confirmed with patient.   Objective:  Physical Exam: tenderness at the surgical site, local edema noted and calf supple, nontender. Incision: healing well, no significant drainage, no dehiscence, no significant erythema  Assessment:   1. Post-operative state   2. Diabetic ulcer of toe of right foot associated with type 2 diabetes mellitus, with fat layer exposed (HCC)     Plan:  Patient was evaluated and treated and all questions answered.  Post-operative State -Sutures removed - partially. -Ointment and band-aid applied. -Ok to shower but not soak. -WBAT in Surgical shoe  No follow-ups on file.

## 2019-08-31 ENCOUNTER — Ambulatory Visit (INDEPENDENT_AMBULATORY_CARE_PROVIDER_SITE_OTHER): Payer: Medicare Other | Admitting: Podiatry

## 2019-08-31 ENCOUNTER — Other Ambulatory Visit: Payer: Self-pay

## 2019-08-31 DIAGNOSIS — S90222A Contusion of left lesser toe(s) with damage to nail, initial encounter: Secondary | ICD-10-CM | POA: Diagnosis not present

## 2019-08-31 NOTE — Progress Notes (Signed)
  Subjective:  Patient ID: Johnny Griffin, male    DOB: 12/23/1980,  MRN: [id number]  No chief complaint on file.  DOS: 08/11/19 Procedure: Partial amputation right 2nd toe  39 y.o. male presents with the above complaint. States new pain to the left 3rd toe caused [place]. Objective:  Physical Exam: no tenderness at the surgical site, no edema noted and calf supple, nontender. Left 3rd toe subungual hematoma. Incision: healed.  Assessment:   1. Subungual hematoma of third toe of left foot, initial encounter     Plan:  Patient was evaluated and treated and all questions answered.  Post-operative State -Remaining sutures removed. -WBAT in normal shoegear  Subungual Hematoma left 3rd toe -Evacuated with 15 blade and nail debridement. -Abx ointment and band-aid applied. No follow-ups on file.

## 2019-09-06 ENCOUNTER — Encounter: Payer: Self-pay | Admitting: Podiatry

## 2019-09-06 ENCOUNTER — Ambulatory Visit (INDEPENDENT_AMBULATORY_CARE_PROVIDER_SITE_OTHER): Payer: Medicare Other | Admitting: Podiatry

## 2019-09-06 DIAGNOSIS — Z5329 Procedure and treatment not carried out because of patient's decision for other reasons: Secondary | ICD-10-CM

## 2019-09-06 NOTE — Progress Notes (Signed)
No show for appt. 

## 2019-09-10 ENCOUNTER — Encounter: Payer: Self-pay | Admitting: Podiatry

## 2019-10-11 ENCOUNTER — Encounter: Payer: Medicare Other | Admitting: Podiatry

## 2019-10-12 ENCOUNTER — Ambulatory Visit (INDEPENDENT_AMBULATORY_CARE_PROVIDER_SITE_OTHER): Payer: Self-pay | Admitting: Podiatry

## 2019-10-12 ENCOUNTER — Other Ambulatory Visit: Payer: Self-pay

## 2019-10-12 DIAGNOSIS — S90222D Contusion of left lesser toe(s) with damage to nail, subsequent encounter: Secondary | ICD-10-CM

## 2019-10-12 NOTE — Progress Notes (Signed)
  Subjective:  Patient ID: Johnny Griffin, male    DOB: Mar 24, 1981,  MRN: [id number]  Chief Complaint  Patient presents with  . hematoma    F/U Lt 3rd toe hemtoma Pt. states,' I think it's alrihgt, little redness and swelling -pain no more than normal Tx: non e  . Diabetes    FBS: 204   DOS: 08/11/19 Procedure: Partial amputation right 2nd toe  39 y.o. male presents with the above complaint. States new pain to the left 3rd toe caused by [place]. Objective:  Physical Exam: no tenderness at the surgical site, no edema noted and calf supple, nontender. Left 3rd toe subungual hematoma resolved. Incision: healed.  Assessment:   1. Subungual hematoma of third toe of left foot, subsequent encounter     Plan:  Patient was evaluated and treated and all questions answered.  Subungual Hematoma left 3rd toe -Resolved -Would benefit from DM shoes given previous hx of toe amputation and high risk foot type.  No follow-ups on file.

## 2019-11-01 NOTE — Progress Notes (Signed)
  Subjective:  Patient ID: Johnny Griffin, male    DOB: October 19, 1980,  MRN: [id number]  Chief Complaint  Patient presents with  . Routine Post Op    POV #1 Pt. states," when I wake up it really hurtt. I take my medicine and it calms it a little bit; 7/10," Tx: sx shoe, abx, and hydrocone (PRN) -dressing clean and intact -pt dneis N/V/FHC   . Diabetes    FBS: 204    DOS: 08/11/19 Procedure: Amputation partial right 2nd toe   39 y.o. male presents with the above complaint. History confirmed with patient.   Objective:  Physical Exam: tenderness at the surgical site, local edema noted and calf supple, nontender. Incision: healing well, no significant drainage, no dehiscence, no significant erythema  Assessment:   1. Osteomyelitis of right foot, unspecified type (HCC)   2. Diabetic ulcer of toe of right foot associated with type 2 diabetes mellitus, with fat layer exposed (HCC)   3. Post-operative state     Plan:  Patient was evaluated and treated and all questions answered.  Post-operative State -Dressing applied consisting of sterile gauze, kerlix and ACE bandage -WBAT in Surgical shoe -Pain medication refilled  Return in about 1 week (around 08/24/2019).

## 2019-12-16 ENCOUNTER — Ambulatory Visit (INDEPENDENT_AMBULATORY_CARE_PROVIDER_SITE_OTHER): Payer: Medicaid Other | Admitting: Podiatry

## 2019-12-16 DIAGNOSIS — Z5329 Procedure and treatment not carried out because of patient's decision for other reasons: Secondary | ICD-10-CM

## 2019-12-16 NOTE — Progress Notes (Signed)
No show for appt.
